# Patient Record
Sex: Male | Born: 1965 | Race: White | Hispanic: No | Marital: Married | Smoking: Never smoker
Health system: Southern US, Community
[De-identification: ages and names within clinical notes are randomized; demographics above are authoritative.]

## PROBLEM LIST (undated history)

## (undated) DIAGNOSIS — F419 Anxiety disorder, unspecified: Secondary | ICD-10-CM

## (undated) DIAGNOSIS — I10 Essential (primary) hypertension: Secondary | ICD-10-CM

## (undated) DIAGNOSIS — T8859XA Other complications of anesthesia, initial encounter: Secondary | ICD-10-CM

## (undated) DIAGNOSIS — Z87442 Personal history of urinary calculi: Secondary | ICD-10-CM

## (undated) DIAGNOSIS — E785 Hyperlipidemia, unspecified: Secondary | ICD-10-CM

## (undated) DIAGNOSIS — M109 Gout, unspecified: Secondary | ICD-10-CM

## (undated) DIAGNOSIS — F1010 Alcohol abuse, uncomplicated: Secondary | ICD-10-CM

## (undated) DIAGNOSIS — K5792 Diverticulitis of intestine, part unspecified, without perforation or abscess without bleeding: Secondary | ICD-10-CM

## (undated) DIAGNOSIS — J45909 Unspecified asthma, uncomplicated: Secondary | ICD-10-CM

## (undated) DIAGNOSIS — K219 Gastro-esophageal reflux disease without esophagitis: Secondary | ICD-10-CM

## (undated) HISTORY — PX: BACK SURGERY: SHX140

## (undated) HISTORY — PX: APPENDECTOMY: SHX54

## (undated) HISTORY — PX: BLADDER SURGERY: SHX569

## (undated) HISTORY — PX: CHOLECYSTECTOMY: SHX55

## (undated) HISTORY — PX: ADENOIDECTOMY: SUR15

---

## 2004-04-09 ENCOUNTER — Emergency Department (HOSPITAL_COMMUNITY): Admission: EM | Admit: 2004-04-09 | Discharge: 2004-04-09 | Payer: Self-pay | Admitting: Emergency Medicine

## 2004-04-10 ENCOUNTER — Inpatient Hospital Stay (HOSPITAL_COMMUNITY): Admission: RE | Admit: 2004-04-10 | Discharge: 2004-04-12 | Payer: Self-pay | Admitting: Psychiatry

## 2006-01-24 ENCOUNTER — Ambulatory Visit: Payer: Self-pay | Admitting: Urology

## 2007-06-01 ENCOUNTER — Ambulatory Visit: Payer: Self-pay | Admitting: Internal Medicine

## 2007-08-02 ENCOUNTER — Ambulatory Visit: Payer: Self-pay | Admitting: Internal Medicine

## 2007-11-20 ENCOUNTER — Ambulatory Visit: Payer: Self-pay | Admitting: Internal Medicine

## 2008-02-06 ENCOUNTER — Inpatient Hospital Stay: Payer: Self-pay | Admitting: Surgery

## 2008-02-26 ENCOUNTER — Emergency Department (HOSPITAL_COMMUNITY): Admission: EM | Admit: 2008-02-26 | Discharge: 2008-02-26 | Payer: Self-pay | Admitting: Emergency Medicine

## 2008-05-06 ENCOUNTER — Ambulatory Visit: Payer: Self-pay | Admitting: Internal Medicine

## 2008-09-16 ENCOUNTER — Inpatient Hospital Stay: Payer: Self-pay | Admitting: Internal Medicine

## 2008-12-16 ENCOUNTER — Encounter (INDEPENDENT_AMBULATORY_CARE_PROVIDER_SITE_OTHER): Payer: Self-pay | Admitting: Interventional Radiology

## 2008-12-16 ENCOUNTER — Ambulatory Visit (HOSPITAL_COMMUNITY): Admission: RE | Admit: 2008-12-16 | Discharge: 2008-12-16 | Payer: Self-pay | Admitting: Gastroenterology

## 2010-12-04 ENCOUNTER — Ambulatory Visit: Payer: Self-pay | Admitting: Urology

## 2010-12-07 ENCOUNTER — Ambulatory Visit: Payer: Self-pay | Admitting: Urology

## 2010-12-13 ENCOUNTER — Encounter: Payer: Self-pay | Admitting: Gastroenterology

## 2011-03-08 LAB — CBC
MCHC: 33.5 g/dL (ref 30.0–36.0)
Platelets: 234 10*3/uL (ref 150–400)
RDW: 13.6 % (ref 11.5–15.5)

## 2011-03-08 LAB — PROTIME-INR
INR: 1.3 (ref 0.00–1.49)
Prothrombin Time: 16.9 seconds — ABNORMAL HIGH (ref 11.6–15.2)

## 2011-04-09 NOTE — Discharge Summary (Signed)
NAME:  Douglas Vega, Douglas Vega NO.:  [id number]   MEDICAL RECORD NO.:  [id number]                   PATIENT TYPE:  IPS   LOCATION:  0500                                 FACILITY:  [facility]   PHYSICIAN:  Jasmine Pang, M.D.              DATE OF BIRTH:  02-28-1966   DATE OF ADMISSION:  04/10/2004  DATE OF DISCHARGE:  04/12/2004                                 DISCHARGE SUMMARY   BRIEF REASON FOR ADMISSION:  The patient was a 45 year old married Caucasian  male who was admitted on a voluntary basis.  He had been using alcohol for  15 years and presented to the emergency department requesting detox.  His  drinking has been increasing over the past 5 years and he states now he  needs to drink in the morning to get going.  He has 1 pint to a fifth of  alcohol daily.  He also states he is using cocaine occasionally and smoking  THC.  Mood is depressed and anxious.  He was on Zoloft 6 months ago but quit  because of the sexual side effects.  He denies suicidal ideation or  homicidal ideation.  No auditory or visual hallucinations.   PAST PSYCHIATRIC HISTORY:  This is his first [facility]  admission, no prior treatment.  As indicated above he had been on Zoloft for  6 weeks but stopped due to sexual side effects.   PAST MEDICAL HISTORY:  Hypertension, fatty liver, appendectomy, T&A.  No  history of seizures.   MEDICATIONS:  Ativan ?dose, Diltiazem XR 180 mg 2 caps daily.   DRUG ALLERGIES:  No known drug allergies.   PHYSICAL EXAMINATION:  See the physical examination sheet from the emergency  department.   ADMISSION LABORATORIES:  TSH was within normal limits.  Urinalysis was  within normal limits.  Other labs were done in the emergency department and  were reviewed by the emergency department physician.   HOSPITAL COURSE:  Upon admission, the patient was placed on a phenobarbital  protocol.  He was given p.r.n. Ambien 10 mg for sleep and Gatorade  was  ordered to be pushed.  He was also placed on Diltiazem 180 mg p.o. q.a.m.  On Apr 10, 2004 he was given additional Diltiazem CD 180 mg p.o. x1 and then  it was ordered to increase Diltiazem to 360 mg CD daily starting May 21 due  to increased blood pressure.  Blood pressure checks being done q.i.d.  On  Apr 11, 2004 he was given Indocin tablets 1 q.4 hours p.r.n. pain from gout.  He was also started on Colchicine 0.6 mg p.o. b.i.d. to start tonight to  treat gout.  On Apr 12, 2004 he was given Vicodin 5/500 1-2 q.6 hours p.r.n.  pain.  On Apr 12, 2004 he was started on Indocin 25 mg 1-2 tabs p.o. q.6  hours p.r.n. pain.  The patient tolerated  medications well.  He tolerated  the phenobarbital detox protocol without incident and was safely detoxed.  He was able to interact appropriately in the unit milieu and groups.  He had  visits from his wife which were beneficial.  She was supportive of him.   At the time of discharge his mental status had improved markedly.  He was  less depressed, had good eye contact, was less anxious.  There was no  suicidal or homicidal ideation.  Psychomotor activity was within normal  limits.  There was no psychosis.  Thought processes were logical and goal  directed.  Cognition was within normal limits.   DISCHARGE DIAGNOSES:   AXIS I:  1. Alcohol dependence.  2. Polysubstance abuse.  3. Depressive disorder not otherwise specified.   AXIS II:  No diagnosis.   AXIS III:  Hypertension, gout.   AXIS IV:  Moderate, work stress.   AXIS V:  Global assessment of function on discharge 50, global assessment of  function upon admission 30, global assessment of function highest past year  62.   DISCHARGE MEDICATIONS:  1. Phenobarbital 15 mg twice daily for 2 days then stop (to complete the     final day of the phenobarbital protocol).  2. Diltiazem CD/ER 360 mg daily.  3. Colchicine 0.6 mg b.i.d.  4. Ambien 10 mg p.o. q.h.s. p.r.n. insomnia.    ACTIVITY LEVEL:  No restrictions.   DIET:  No restrictions.   POST HOSPITAL CARE PLAN:  The patient will attend the [facility] and he will be  referred for further psychiatric care and therapy from there.  He stated he  may attend an IOP program closer to [facility] if this could be  arranged.   CONDITION ON DISCHARGE:  Improved.                                               Jasmine Pang, M.D.    Mosie Epstein  D:  06/12/2004  T:  06/13/2004  Job:  [id number]

## 2011-08-17 LAB — COMPREHENSIVE METABOLIC PANEL
AST: 69 — ABNORMAL HIGH
Albumin: 3.8
Alkaline Phosphatase: 126 — ABNORMAL HIGH
BUN: 4 — ABNORMAL LOW
Chloride: 99
Creatinine, Ser: 0.64
GFR calc Af Amer: >60
Potassium: 4.2
Total Bilirubin: 1.3 — ABNORMAL HIGH
Total Protein: 6.9

## 2011-08-17 LAB — CBC
Platelets: 169
RDW: 15.6 — ABNORMAL HIGH
WBC: 6.2

## 2011-08-17 LAB — URINALYSIS, ROUTINE W REFLEX MICROSCOPIC
Bilirubin Urine: NEGATIVE
Glucose, UA: NEGATIVE
Hgb urine dipstick: NEGATIVE
Ketones, ur: NEGATIVE
Specific Gravity, Urine: 1.011
pH: 8

## 2011-08-17 LAB — DIFFERENTIAL
Basophils Absolute: 0
Eosinophils Relative: 0
Lymphocytes Relative: 9 — ABNORMAL LOW
Monocytes Absolute: 0.5
Monocytes Relative: 8
Neutro Abs: 5.1

## 2011-08-17 LAB — POCT I-STAT, CHEM 8
Calcium, Ion: 0.99 — ABNORMAL LOW
Chloride: 103
Creatinine, Ser: 0.6
Glucose, Bld: 112 — ABNORMAL HIGH
HCT: 44
Potassium: 4.2

## 2017-01-31 ENCOUNTER — Encounter: Payer: Self-pay | Admitting: *Deleted

## 2017-02-01 ENCOUNTER — Ambulatory Visit: Payer: BC Managed Care – PPO | Admitting: *Deleted

## 2017-02-01 ENCOUNTER — Encounter: Payer: Self-pay | Admitting: Gastroenterology

## 2017-02-01 ENCOUNTER — Ambulatory Visit
Admission: RE | Admit: 2017-02-01 | Discharge: 2017-02-01 | Disposition: A | Discharge status: Home / Self Care | Payer: BC Managed Care – PPO | Source: Ambulatory Visit | Attending: Gastroenterology | Admitting: Gastroenterology

## 2017-02-01 ENCOUNTER — Encounter
Admission: RE | Disposition: A | Discharge status: Home / Self Care | Payer: Self-pay | Source: Ambulatory Visit | Attending: Gastroenterology

## 2017-02-01 DIAGNOSIS — K5730 Diverticulosis of large intestine without perforation or abscess without bleeding: Secondary | ICD-10-CM | POA: Diagnosis not present

## 2017-02-01 DIAGNOSIS — Z79899 Other long term (current) drug therapy: Secondary | ICD-10-CM | POA: Diagnosis not present

## 2017-02-01 DIAGNOSIS — I10 Essential (primary) hypertension: Secondary | ICD-10-CM | POA: Diagnosis not present

## 2017-02-01 DIAGNOSIS — Z1211 Encounter for screening for malignant neoplasm of colon: Secondary | ICD-10-CM | POA: Diagnosis present

## 2017-02-01 DIAGNOSIS — J45909 Unspecified asthma, uncomplicated: Secondary | ICD-10-CM | POA: Insufficient documentation

## 2017-02-01 DIAGNOSIS — Z7982 Long term (current) use of aspirin: Secondary | ICD-10-CM | POA: Diagnosis not present

## 2017-02-01 DIAGNOSIS — K219 Gastro-esophageal reflux disease without esophagitis: Secondary | ICD-10-CM | POA: Diagnosis not present

## 2017-02-01 DIAGNOSIS — F419 Anxiety disorder, unspecified: Secondary | ICD-10-CM | POA: Diagnosis not present

## 2017-02-01 DIAGNOSIS — M109 Gout, unspecified: Secondary | ICD-10-CM | POA: Insufficient documentation

## 2017-02-01 DIAGNOSIS — K640 First degree hemorrhoids: Secondary | ICD-10-CM | POA: Insufficient documentation

## 2017-02-01 DIAGNOSIS — Z8371 Family history of colonic polyps: Secondary | ICD-10-CM | POA: Insufficient documentation

## 2017-02-01 DIAGNOSIS — E785 Hyperlipidemia, unspecified: Secondary | ICD-10-CM | POA: Insufficient documentation

## 2017-02-01 HISTORY — DX: Essential (primary) hypertension: I10

## 2017-02-01 HISTORY — DX: Alcohol abuse, uncomplicated: F10.10

## 2017-02-01 HISTORY — DX: Diverticulitis of intestine, part unspecified, without perforation or abscess without bleeding: K57.92

## 2017-02-01 HISTORY — DX: Hyperlipidemia, unspecified: E78.5

## 2017-02-01 HISTORY — DX: Unspecified asthma, uncomplicated: J45.909

## 2017-02-01 HISTORY — PX: COLONOSCOPY WITH PROPOFOL: SHX5780

## 2017-02-01 HISTORY — DX: Gastro-esophageal reflux disease without esophagitis: K21.9

## 2017-02-01 HISTORY — DX: Gout, unspecified: M10.9

## 2017-02-01 HISTORY — DX: Anxiety disorder, unspecified: F41.9

## 2017-02-01 SURGERY — COLONOSCOPY WITH PROPOFOL
Anesthesia: General

## 2017-02-01 MED ORDER — PROPOFOL 500 MG/50ML IV EMUL
INTRAVENOUS | Status: AC
  Filled 2017-02-01: qty 50

## 2017-02-01 MED ORDER — PROPOFOL 500 MG/50ML IV EMUL
INTRAVENOUS | Status: DC | PRN
  Administered 2017-02-01: 2500 ug via INTRAVENOUS

## 2017-02-01 MED ORDER — MIDAZOLAM HCL 2 MG/2ML IJ SOLN
INTRAMUSCULAR | Status: AC
  Filled 2017-02-01: qty 2

## 2017-02-01 MED ORDER — SODIUM CHLORIDE 0.9 % IV SOLN
INTRAVENOUS | Status: DC
  Administered 2017-02-01: 11:00:00 via INTRAVENOUS

## 2017-02-01 MED ORDER — SODIUM CHLORIDE 0.9 % IV SOLN
INTRAVENOUS | Status: DC
  Administered 2017-02-01: 1000 mL via INTRAVENOUS

## 2017-02-01 NOTE — Anesthesia Postprocedure Evaluation (Signed)
Anesthesia Post Note  Patient: Douglas Vega  Procedure(s) Performed: Procedure(s) (LRB): COLONOSCOPY WITH PROPOFOL (N/A)  Patient location during evaluation: Endoscopy Anesthesia Type: General Level of consciousness: awake and alert and oriented Pain management: pain level controlled Vital Signs Assessment: post-procedure vital signs reviewed and stable Respiratory status: spontaneous breathing, nonlabored ventilation and respiratory function stable Cardiovascular status: blood pressure returned to baseline and stable Postop Assessment: no signs of nausea or vomiting Anesthetic complications: no     Last Vitals:  Vitals:   02/01/17 1210 02/01/17 1220  BP: 125/64   Pulse:    Resp:  19  Temp:      Last Pain:  Vitals:   02/01/17 1200  TempSrc: Tympanic                 [name]

## 2017-02-01 NOTE — Transfer of Care (Signed)
Immediate Anesthesia Transfer of Care Note  Patient: Douglas Vega  Procedure(s) Performed: Procedure(s): COLONOSCOPY WITH PROPOFOL (N/A)  Patient Location: PACU  Anesthesia Type:General  Level of Consciousness: awake, alert  and oriented  Airway & Oxygen Therapy: Patient Spontanous Breathing and Patient connected to nasal cannula oxygen  Post-op Assessment: Report given to RN and Post -op Vital signs reviewed and stable  Post vital signs: Reviewed and stable  Last Vitals:  Vitals:   02/01/17 1045 02/01/17 1200  BP: (!) 156/88 (!) 103/57  Pulse: 70 65  Resp: 16 16  Temp: (!) 35.9 C 36.2 C    Last Pain:  Vitals:   02/01/17 1200  TempSrc: Tympanic         Complications: No apparent anesthesia complications

## 2017-02-01 NOTE — Anesthesia Post-op Follow-up Note (Cosign Needed)
Anesthesia QCDR form completed.        

## 2017-02-01 NOTE — Op Note (Signed)
[facility] Gastroenterology Patient Name: Ranger Gallien Procedure Date: 02/01/2017 11:11 AM MRN: [id number] Account #: [id number] Date of Birth: 1966-01-19 Admit Type: Outpatient Age: 51 Room: [facility] ENDO ROOM 3 Gender: Male Note Status: Finalized Procedure:            Colonoscopy Indications:          Screening for colorectal malignant neoplasm Providers:            Lollie Sails, MD Referring MD:         Ocie Cornfield. Ouida Sills MD, MD (Referring MD) Medicines:            Monitored Anesthesia Care Complications:        No immediate complications. Procedure:            Pre-Anesthesia Assessment:                       - ASA Grade Assessment: III - A patient with severe                        systemic disease.                       After obtaining informed consent, the colonoscope was                        passed under direct vision. Throughout the procedure,                        the patient's blood pressure, pulse, and oxygen                        saturations were monitored continuously. The                        Colonoscope was introduced through the anus and                        advanced to the the cecum, identified by appendiceal                        orifice and ileocecal valve. The colonoscopy was                        performed with moderate difficulty due to multiple                        diverticula in the colon. Successful completion of the                        procedure was aided by changing the patient to a supine                        position and changing the patient to a prone position.                        The quality of the bowel preparation was good. Findings:      Many small and large-mouthed diverticula were found in the sigmoid colon       and descending colon.      Non-bleeding internal hemorrhoids were found during retroflexion, during  digital exam and during anoscopy. The hemorrhoids were medium-sized and       Grade I  (internal hemorrhoids that do not prolapse).      The digital rectal exam was otherwise normal. Impression:           - Diverticulosis in the sigmoid colon and in the                        descending colon.                       - Non-bleeding internal hemorrhoids.                       - No specimens collected. Recommendation:       - Discharge patient to home.                       - Repeat colonoscopy in 10 years for screening purposes. Procedure Code(s):    --- Professional ---                       [phone], Colonoscopy, flexible; diagnostic, including                        collection of specimen(s) by brushing or washing, when                        performed (separate procedure) Diagnosis Code(s):    --- Professional ---                       Z12.11, Encounter for screening for malignant neoplasm                        of colon                       K64.0, First degree hemorrhoids                       K57.30, Diverticulosis of large intestine without                        perforation or abscess without bleeding CPT copyright 2016 American Medical Association. All rights reserved. The codes documented in this report are preliminary and upon coder review may  be revised to meet current compliance requirements. Lollie Sails, MD 02/01/2017 12:00:24 PM This report has been signed electronically. Number of Addenda: 0 Note Initiated On: 02/01/2017 11:11 AM Scope Withdrawal Time: 0 hours 9 minutes 11 seconds  Total Procedure Duration: 0 hours 21 minutes 6 seconds       [facility]

## 2017-02-01 NOTE — H&P (Signed)
Outpatient short stay form Pre-procedure 02/01/2017 10:55 AM Lollie Sails MD  Primary Physician: Dr. Frazier Richards  Reason for visit:  Colonoscopy  History of present illness:  Patient is a 51 year old male presenting today as above. There is a family history of colon polyps and a primary relative, mother. Does take a daily 81 mg aspirin the last time this morning. He tolerated his prep well. He denies use of any other blood thinning agents.    Current Facility-Administered Medications:  .  0.9 %  sodium chloride infusion, , Intravenous, Continuous, Lollie Sails, MD, Last Rate: 20 mL/hr at 02/01/17 1054, 1,000 mL at 02/01/17 1054 .  0.9 %  sodium chloride infusion, , Intravenous, Continuous, Lollie Sails, MD  Prescriptions Prior to Admission  Medication Sig Dispense Refill Last Dose  . albuterol (PROVENTIL HFA;VENTOLIN HFA) 108 (90 Base) MCG/ACT inhaler Inhale 2 puffs into the lungs every 6 (six) hours as needed for wheezing or shortness of breath.   Past Month at Unknown time  . aspirin EC 81 MG tablet Take 81 mg by mouth daily.   02/01/2017 at 0800  . omeprazole (PRILOSEC) 20 MG capsule Take 20 mg by mouth daily.   02/01/2017 at 0800  . valsartan-hydrochlorothiazide (DIOVAN-HCT) 80-12.5 MG tablet Take 1 tablet by mouth daily.   02/01/2017 at 0800  . predniSONE (DELTASONE) 20 MG tablet Take 20 mg by mouth daily with breakfast.   Not Taking at Unknown time     No Known Allergies   Past Medical History:  Diagnosis Date  . Alcohol abuse   . Anxiety   . Asthma   . Diverticulitis   . GERD (gastroesophageal reflux disease)   . Gout   . Hyperlipidemia   . Hypertension     Review of systems:      Physical Exam    Heart and lungs: Regular rate and rhythm without rub or gallop, lungs are bilaterally clear.    HEENT: Normocephalic atraumatic eyes are anicteric    Other:     Pertinant exam for procedure: Soft nontender nondistended bowel sounds positive  normoactive.    Planned proceedures: Colonoscopy and indicated procedures. I have discussed the risks benefits and complications of procedures to include not limited to bleeding, infection, perforation and the risk of sedation and the patient wishes to proceed.    Lollie Sails, MD Gastroenterology 02/01/2017  10:55 AM

## 2017-02-01 NOTE — Anesthesia Preprocedure Evaluation (Signed)
Anesthesia Evaluation  Patient identified by MRN, date of birth, ID band Patient awake    Reviewed: Allergy & Precautions, NPO status , Patient's Chart, lab work & pertinent test results  History of Anesthesia Complications Negative for: history of anesthetic complications  Airway Mallampati: II  TM Distance: >3 FB Neck ROM: Full    Dental no notable dental hx.    Pulmonary neg sleep apnea, neg COPD,    breath sounds clear to auscultation- rhonchi (-) wheezing      Cardiovascular Exercise Tolerance: Good hypertension, Pt. on medications (-) angina(-) CAD and (-) Past MI  Rhythm:Regular Rate:Normal - Systolic murmurs and - Diastolic murmurs    Neuro/Psych negative neurological ROS  negative psych ROS   GI/Hepatic Neg liver ROS, GERD  ,  Endo/Other  negative endocrine ROSneg diabetes  Renal/GU negative Renal ROS     Musculoskeletal negative musculoskeletal ROS (+)   Abdominal (+) - obese,   Peds  Hematology negative hematology ROS (+)   Anesthesia Other Findings Past Medical History: No date: Alcohol abuse No date: Anxiety No date: Asthma No date: Diverticulitis No date: GERD (gastroesophageal reflux disease) No date: Gout No date: Hyperlipidemia No date: Hypertension   Reproductive/Obstetrics                             Anesthesia Physical Anesthesia Plan  ASA: II  Anesthesia Plan: General   Post-op Pain Management:    Induction: Intravenous  Airway Management Planned: Natural Airway  Additional Equipment:   Intra-op Plan:   Post-operative Plan:   Informed Consent: I have reviewed the patients History and Physical, chart, labs and discussed the procedure including the risks, benefits and alternatives for the proposed anesthesia with the patient or authorized representative who has indicated his/her understanding and acceptance.   Dental advisory given  Plan  Discussed with: CRNA and Anesthesiologist  Anesthesia Plan Comments:         Anesthesia Quick Evaluation

## 2019-02-28 DIAGNOSIS — C4491 Basal cell carcinoma of skin, unspecified: Secondary | ICD-10-CM

## 2019-02-28 HISTORY — DX: Basal cell carcinoma of skin, unspecified: C44.91

## 2020-01-15 DIAGNOSIS — L570 Actinic keratosis: Secondary | ICD-10-CM

## 2020-01-15 HISTORY — DX: Actinic keratosis: L57.0

## 2020-02-18 ENCOUNTER — Ambulatory Visit: Payer: BC Managed Care – PPO

## 2020-02-28 ENCOUNTER — Ambulatory Visit: Payer: BC Managed Care – PPO | Admitting: Dermatology

## 2020-03-06 ENCOUNTER — Ambulatory Visit: Payer: BC Managed Care – PPO | Admitting: Dermatology

## 2020-03-06 ENCOUNTER — Other Ambulatory Visit: Payer: Self-pay

## 2020-03-06 ENCOUNTER — Encounter: Payer: Self-pay | Admitting: Dermatology

## 2020-03-06 DIAGNOSIS — L578 Other skin changes due to chronic exposure to nonionizing radiation: Secondary | ICD-10-CM | POA: Diagnosis not present

## 2020-03-06 DIAGNOSIS — L570 Actinic keratosis: Secondary | ICD-10-CM | POA: Diagnosis not present

## 2020-03-06 DIAGNOSIS — Z85828 Personal history of other malignant neoplasm of skin: Secondary | ICD-10-CM

## 2020-03-06 NOTE — Progress Notes (Signed)
   Follow-Up Visit   Subjective  [place] is a 54 y.o. male who presents for the following: Follow-up (bx). Patient has a biopsy proven AK to be treated today. He has a history of BCC of the right anterior chin status post Mohs surgery recently.  He is healing well but has some nerve changes residually.  This is improving.  The following portions of the chart were reviewed this encounter and updated as appropriate: Tobacco  Allergies  Meds  Problems  Med Hx  Surg Hx  Fam Hx      Review of Systems: No other skin or systemic complaints.  Objective  Well appearing patient in no apparent distress; mood and affect are within normal limits.  A focused examination was performed including face. Relevant physical exam findings are noted in the Assessment and Plan.  Objective  Nasal Tip: Pink biopsy site.  Objective  Right Anterior Chin: Well healed scar with no evidence of recurrence.   Assessment & Plan   Actinic Damage - diffuse scaly erythematous macules with underlying dyspigmentation - Recommend daily broad spectrum sunscreen SPF 30+ to sun-exposed areas, reapply every 2 hours as needed.  - Call for new or changing lesions.   AK (actinic keratosis) Nasal Tip  Biopsy proven.  Will start topical chemotherapy on f/u.  Destruction of lesion - Nasal Tip Complexity: simple   Destruction method: cryotherapy   Informed consent: discussed and consent obtained   Timeout:  patient name, date of birth, surgical site, and procedure verified Lesion destroyed using liquid nitrogen: Yes   Region frozen until ice ball extended beyond lesion: Yes   Outcome: patient tolerated procedure well with no complications   Post-procedure details: wound care instructions given    History of basal cell carcinoma (BCC) Right Anterior Chin  Clear. Observe for recurrence. Call clinic for new or changing lesions.  Recommend regular skin exams, daily broad-spectrum spf 30+ sunscreen use,  and photoprotection.     Return in about 2 months (around 05/06/2020) for AK, nasal tip and TBSE.   Lindi Adie, CMA, am acting as scribe for Sarina Ser, MD . Documentation: I have reviewed the above documentation for accuracy and completeness, and I agree with the above.  Sarina Ser, MD

## 2020-05-20 ENCOUNTER — Other Ambulatory Visit: Payer: Self-pay

## 2020-05-20 ENCOUNTER — Ambulatory Visit: Payer: BC Managed Care – PPO | Admitting: Dermatology

## 2020-05-20 DIAGNOSIS — D229 Melanocytic nevi, unspecified: Secondary | ICD-10-CM | POA: Diagnosis not present

## 2020-05-20 DIAGNOSIS — L570 Actinic keratosis: Secondary | ICD-10-CM

## 2020-05-20 DIAGNOSIS — L814 Other melanin hyperpigmentation: Secondary | ICD-10-CM

## 2020-05-20 DIAGNOSIS — D1800 Hemangioma unspecified site: Secondary | ICD-10-CM

## 2020-05-20 DIAGNOSIS — Z85828 Personal history of other malignant neoplasm of skin: Secondary | ICD-10-CM | POA: Diagnosis not present

## 2020-05-20 DIAGNOSIS — Z1283 Encounter for screening for malignant neoplasm of skin: Secondary | ICD-10-CM | POA: Diagnosis not present

## 2020-05-20 DIAGNOSIS — L821 Other seborrheic keratosis: Secondary | ICD-10-CM

## 2020-05-20 DIAGNOSIS — S0081XA Abrasion of other part of head, initial encounter: Secondary | ICD-10-CM

## 2020-05-20 DIAGNOSIS — L578 Other skin changes due to chronic exposure to nonionizing radiation: Secondary | ICD-10-CM

## 2020-05-20 DIAGNOSIS — T148XXA Other injury of unspecified body region, initial encounter: Secondary | ICD-10-CM

## 2020-05-20 MED ORDER — MUPIROCIN 2 % EX OINT: TOPICAL_OINTMENT | CUTANEOUS | 1 refills | Status: DC

## 2020-05-20 NOTE — Progress Notes (Signed)
Follow-Up Visit   Subjective  Douglas Vega is a 54 y.o. male who presents for the following: Annual Exam (TBSE, Hx of BCC on the R chin treated with Mohs, ), Actinic Keratosis (f/u hx of AKs nose ), and sore spot (check a spot on his L upper lip pt cut shaving 1 day ago ). The patient presents for Total-Body Skin Exam (TBSE) for skin cancer screening and mole check.  The following portions of the chart were reviewed this encounter and updated as appropriate:  Tobacco  Allergies  Meds  Problems  Med Hx  Surg Hx  Fam Hx      Review of Systems:  No other skin or systemic complaints except as noted in HPI or Assessment and Plan.  Objective  Well appearing patient in no apparent distress; mood and affect are within normal limits.  A full examination was performed including scalp, head, eyes, ears, nose, lips, neck, chest, axillae, abdomen, back, buttocks, bilateral upper extremities, bilateral lower extremities, hands, feet, fingers, toes, fingernails, and toenails. All findings within normal limits unless otherwise noted below.  Objective  Nasal tip, L zygoma (2): Erythematous thin papules/macules with gritty scale.   Images      Objective  chin: Crusted patch   Objective  R ant chin: Well healed scar with no evidence of recurrence.    Assessment & Plan    AK (actinic keratosis) (2) Nasal tip, L zygoma  May consider re-biopsy if not gone after Ln2, 5FU treatment  Start 5FU apply to nose bid x 7 days, begin in 1 month  [email or web address]  Destruction of lesion - Nasal tip, L zygoma Complexity: simple   Destruction method: cryotherapy   Informed consent: discussed and consent obtained   Timeout:  patient name, date of birth, surgical site, and procedure verified Lesion destroyed using liquid nitrogen: Yes   Region frozen until ice ball extended beyond lesion: Yes   Outcome: patient tolerated procedure well with no complications   Post-procedure details:  wound care instructions given    Abrasion chin Traumatic shaving abrasion  Start Mupirocin ointment apply to skin qd-bid   Ordered Medications: mupirocin ointment (BACTROBAN) 2 %  History of basal cell carcinoma (BCC) R ant chin S/P Mohs. Clear. Observe for recurrence. Call clinic for new or changing lesions.  Recommend regular skin exams, daily broad-spectrum spf 30+ sunscreen use, and photoprotection.   Observe    Lentigines - Scattered tan macules - Discussed due to sun exposure - Benign, observe - Call for any changes  Seborrheic Keratoses - Stuck-on, waxy, tan-brown papules and plaques  - Discussed benign etiology and prognosis. - Observe - Call for any changes  Melanocytic Nevi - Tan-brown and/or pink-flesh-colored symmetric macules and papules - Benign appearing on exam today - Observation - Call clinic for new or changing moles - Recommend daily use of broad spectrum spf 30+ sunscreen to sun-exposed areas.   Hemangiomas - Red papules - Discussed benign nature - Observe - Call for any changes  Actinic Damage - diffuse scaly erythematous macules with underlying dyspigmentation - Recommend daily broad spectrum sunscreen SPF 30+ to sun-exposed areas, reapply every 2 hours as needed.  - Call for new or changing lesions.  Skin cancer screening performed today.  Return in about 3 months (around 08/20/2020) for s.   I, Marye Round, CMA, am acting as scribe for Sarina Ser, MD .  Documentation: I have reviewed the above documentation for accuracy and completeness, and I agree with the  above.  Sarina Ser, MD

## 2020-05-20 NOTE — Patient Instructions (Addendum)
Cryotherapy Aftercare  . Wash gently with soap and water everyday.   . Apply Vaseline and Band-Aid daily until healed.  Instructions for [facility] Medications  One or more of your medications was sent to the [facility] mail order compounding pharmacy. You will receive an email from them and can purchase the medicine through that link. It will then be mailed to your home at the address you confirmed. If for any reason you do not receive an email from them, please check your spam folder. If you still do not find the email, please let us know.    

## 2020-06-02 ENCOUNTER — Encounter: Payer: Self-pay | Admitting: Dermatology

## 2020-08-07 ENCOUNTER — Ambulatory Visit: Payer: BC Managed Care – PPO | Admitting: Dermatology

## 2020-08-07 ENCOUNTER — Encounter: Payer: Self-pay | Admitting: Dermatology

## 2020-08-07 ENCOUNTER — Other Ambulatory Visit: Payer: Self-pay

## 2020-08-07 DIAGNOSIS — Z872 Personal history of diseases of the skin and subcutaneous tissue: Secondary | ICD-10-CM | POA: Diagnosis not present

## 2020-08-07 DIAGNOSIS — L578 Other skin changes due to chronic exposure to nonionizing radiation: Secondary | ICD-10-CM | POA: Diagnosis not present

## 2020-08-07 DIAGNOSIS — L570 Actinic keratosis: Secondary | ICD-10-CM

## 2020-08-07 DIAGNOSIS — Z85828 Personal history of other malignant neoplasm of skin: Secondary | ICD-10-CM

## 2020-08-07 NOTE — Progress Notes (Signed)
   Follow-Up Visit   Subjective  Douglas Vega is a 54 y.o. male who presents for the following: Actinic Keratosis (Nasal tip, L zygoma, 17m f/u, LN2 and 5FU/Calcipotriene x 1 wk, he did get a reaction, pt did say he picks at the one on the nasal tip).  The following portions of the chart were reviewed this encounter and updated as appropriate:  Tobacco  Allergies  Meds  Problems  Med Hx  Surg Hx  Fam Hx     Review of Systems:  No other skin or systemic complaints except as noted in HPI or Assessment and Plan.  Objective  Well appearing patient in no apparent distress; mood and affect are within normal limits.  A focused examination was performed including face. Relevant physical exam findings are noted in the Assessment and Plan.  Objective  nasal tip x 1: Residual pink scaly macule  Objective  L zygoma: Clear today  Objective  R ant chin: Well healed scar with no evidence of recurrence.    Assessment & Plan  AK (actinic keratosis) nasal tip x 1  Bx proven 01/15/20 Hx of Ln2 03/06/20, 05/20/20 and 5FU/Calcipotriene x 1 week  Today Ln2 again,  In 2 weeks restart 5FU/Calcipotriene cream bid x 1 week to nasal tip  Destruction of lesion - nasal tip x 1 Complexity: simple   Destruction method: cryotherapy   Informed consent: discussed and consent obtained   Timeout:  patient name, date of birth, surgical site, and procedure verified Lesion destroyed using liquid nitrogen: Yes   Region frozen until ice ball extended beyond lesion: Yes   Outcome: patient tolerated procedure well with no complications   Post-procedure details: wound care instructions given    History of actinic keratoses L zygoma Clear, observe for changes   History of basal cell carcinoma (BCC) R ant chin S/P Mohs Clear. Observe for recurrence. Call clinic for new or changing lesions.  Recommend regular skin exams, daily broad-spectrum spf 30+ sunscreen use, and photoprotection.      Actinic Damage - diffuse scaly erythematous macules with underlying dyspigmentation - Recommend daily broad spectrum sunscreen SPF 30+ to sun-exposed areas, reapply every 2 hours as needed.  - Call for new or changing lesions.  Return in about 8 weeks (around 10/02/2020) for AK f/u Nasal tip.   I, Douglas Vega, RMA, am acting as scribe for Douglas Ser, MD .  Documentation: I have reviewed the above documentation for accuracy and completeness, and I agree with the above.  Douglas Ser, MD

## 2020-08-12 ENCOUNTER — Encounter: Payer: Self-pay | Admitting: Dermatology

## 2020-09-01 ENCOUNTER — Ambulatory Visit: Payer: BC Managed Care – PPO | Admitting: Dermatology

## 2020-09-16 ENCOUNTER — Other Ambulatory Visit: Payer: Self-pay | Admitting: Physical Medicine & Rehabilitation

## 2020-09-16 DIAGNOSIS — G8929 Other chronic pain: Secondary | ICD-10-CM

## 2020-09-18 ENCOUNTER — Ambulatory Visit
Admission: RE | Admit: 2020-09-18 | Discharge: 2020-09-18 | Disposition: A | Discharge status: Home / Self Care | Payer: BC Managed Care – PPO | Source: Ambulatory Visit | Attending: Physical Medicine & Rehabilitation | Admitting: Physical Medicine & Rehabilitation

## 2020-09-18 DIAGNOSIS — G8929 Other chronic pain: Secondary | ICD-10-CM

## 2020-09-18 DIAGNOSIS — M5441 Lumbago with sciatica, right side: Secondary | ICD-10-CM

## 2020-12-09 IMAGING — MR MR LUMBAR SPINE W/O CM
4 series · 22 of 48 positions shown · non-contrast
Comparison: 08/02/2007

CLINICAL DATA: Low back pain with bilateral hip and buttock pain.
Right side is worse.

EXAM:
MRI LUMBAR SPINE WITHOUT CONTRAST
TECHNIQUE: Multiplanar, multisequence MR imaging of the lumbar spine was
performed. No intravenous contrast was administered.

[Series 6: T2 · sagittal · 4.0mm · 0.73mm/px · 9 of 15 slices shown (1 of 2)]
[im 1/15]
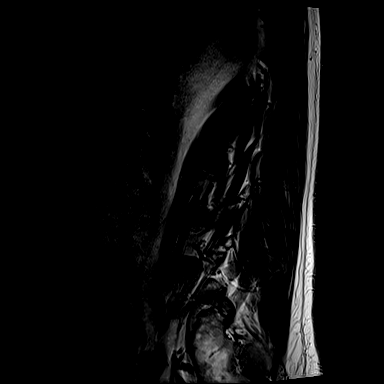
[im 2/15]
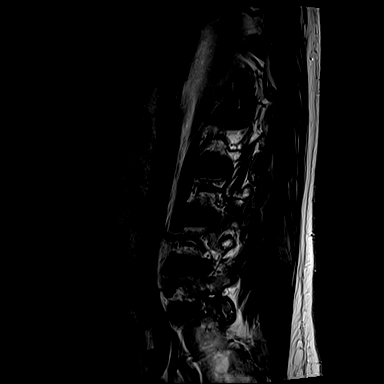
[im 4/15]
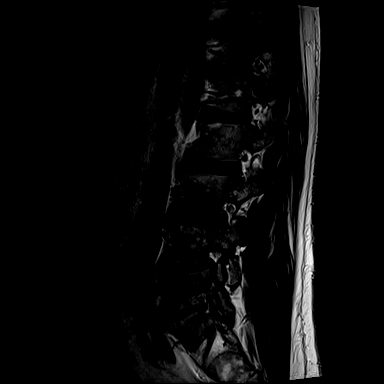
[im 6/15]
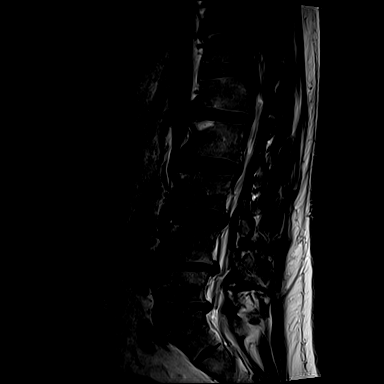
[im 8/15]
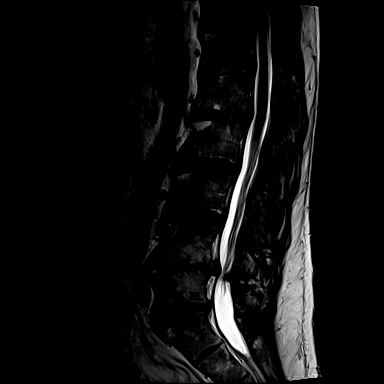
[im 9/15]
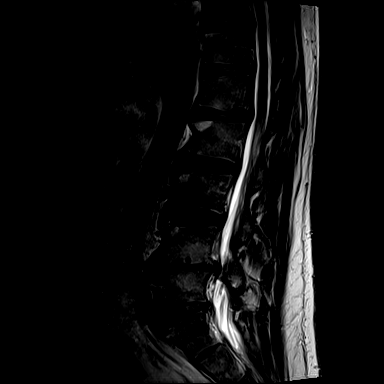
[im 11/15]
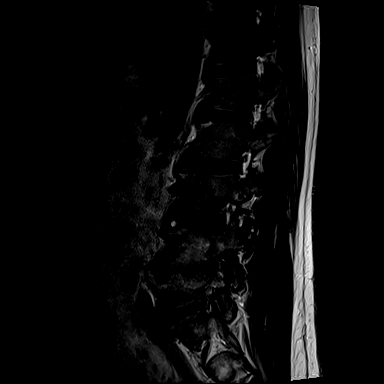
[im 13/15]
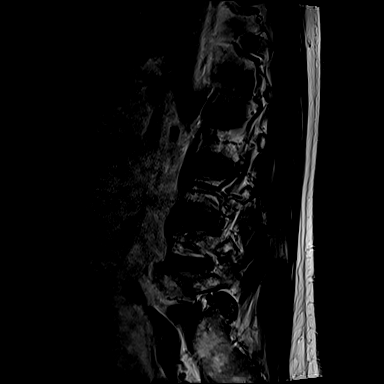
[im 15/15]
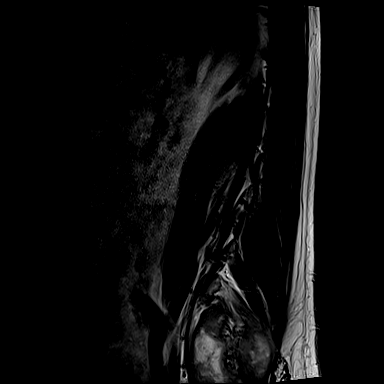

[Series 7: T1 · sagittal · 4.0mm · 0.73mm/px · 3 of 15 slices shown]
[im 3/15]
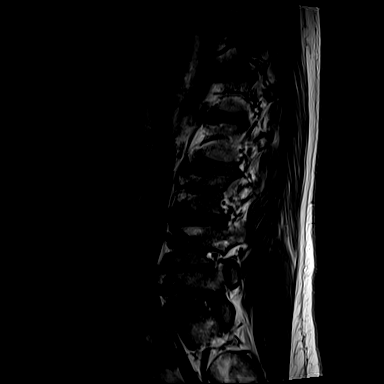
[im 9/15]
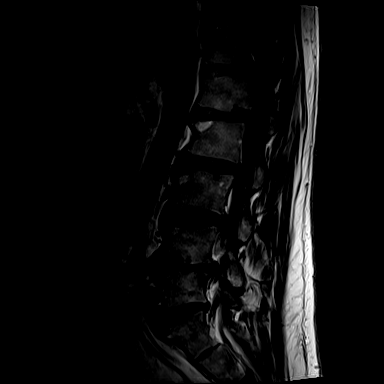
[im 13/15]
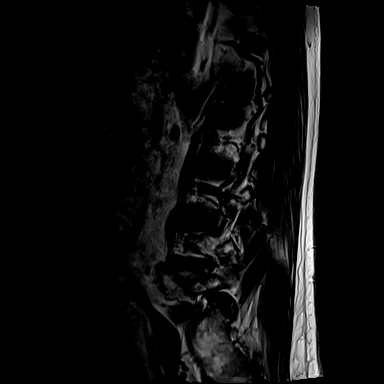

[Series 8: STIR · sagittal · 4.0mm · 0.88mm/px · 3 of 15 slices shown]
[im 3/15]
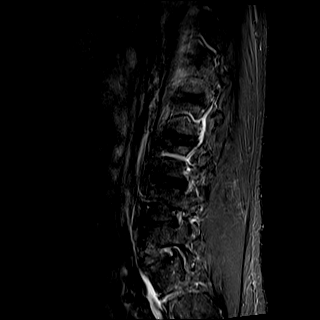
[im 9/15]
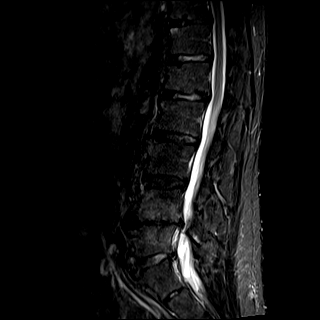
[im 13/15]
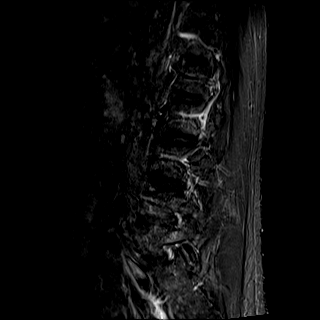

[Series 13: T2 · axial · 4.0mm · 0.28mm/px · z∈[-175,+1]mm · 7 of 42 slices shown (2 of 2)]
[im 2/42]
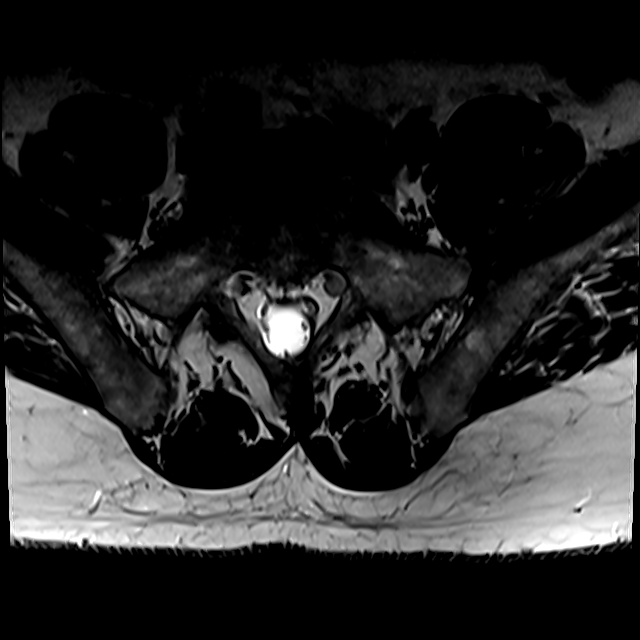
[im 6/42]
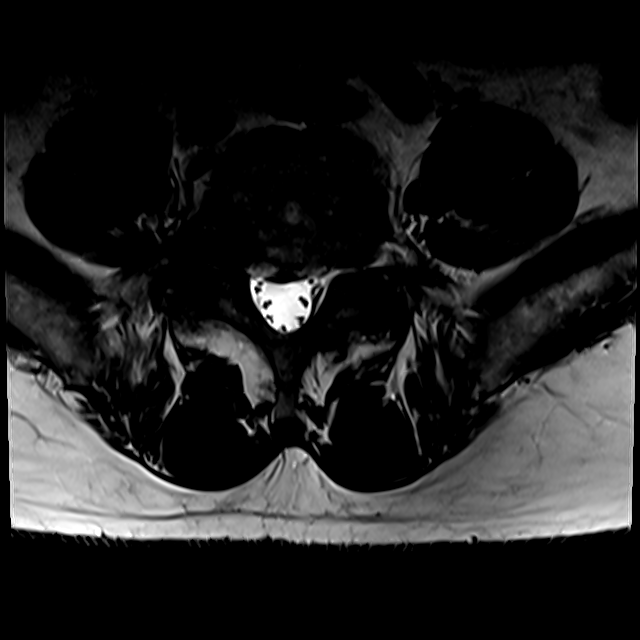
[im 8/42]
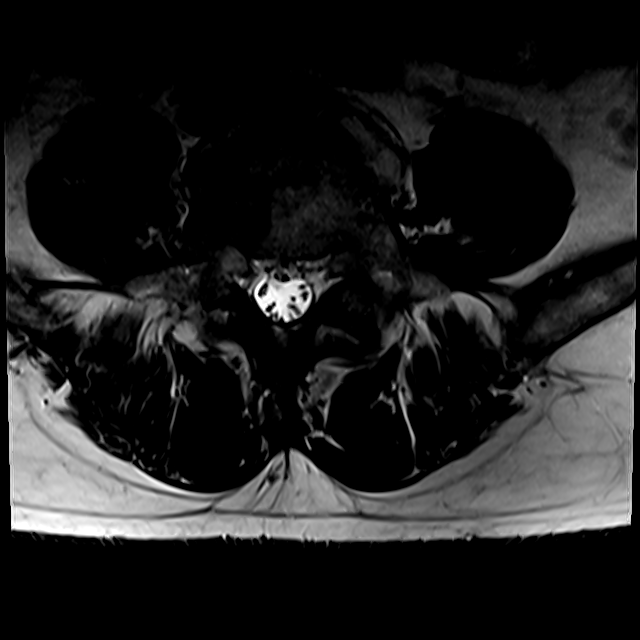
[im 14/42]
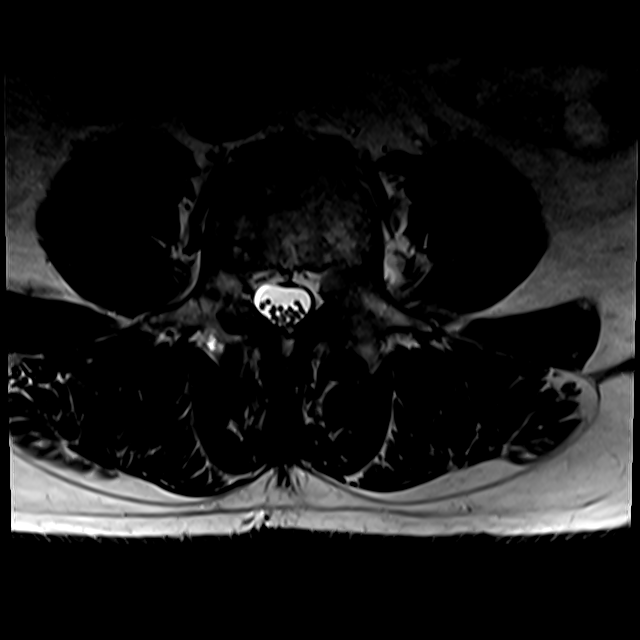
[im 19/42]
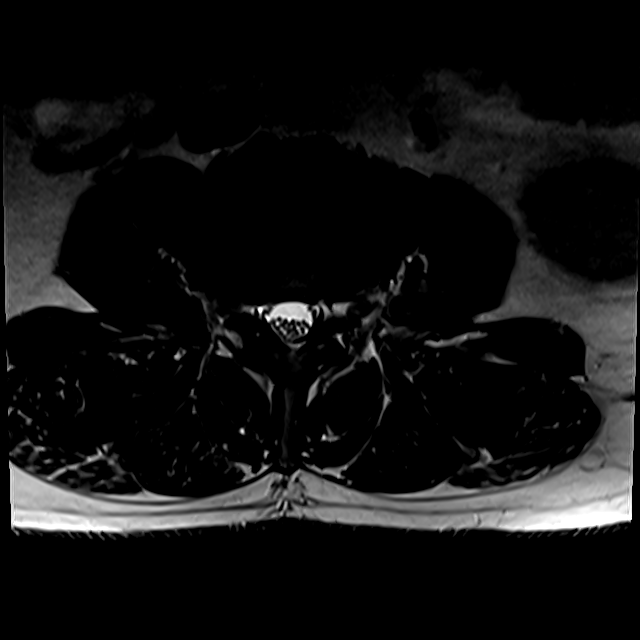
[im 21/42]
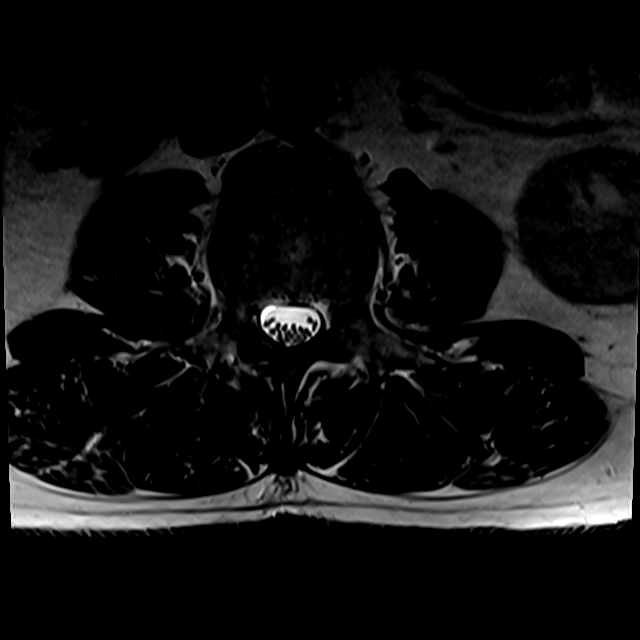
[im 36/42]
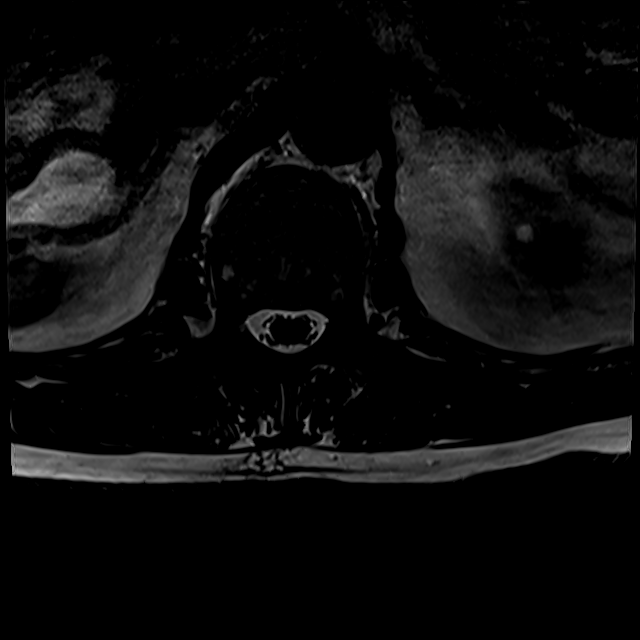

[22 of 48 positions shown; findings below may reference images not displayed]

FINDINGS: Segmentation: The inferior-most fully formed intervertebral disc is
labeled L5-S1.

Alignment: Trace, stepwise retrolisthesis of L2 on L3, L3 on L4, L4
on L5, and L5 on S1.

Vertebrae: No evidence of acute fracture, discitis/osteomyelitis, or
suspicious bone lesion. Degenerative/discogenic endplate signal
changes about the left L4-L5 disc.

Conus medullaris and cauda equina: Conus extends to the L2-L3 level.
Conus and cauda equina appear normal.

Paraspinal and other soft tissues: Negative.

Disc levels:

T12-L1: No significant disc protrusion, foraminal stenosis, or canal
stenosis.

L1-L2: Minimal disc bulge without significant canal or foraminal
stenosis.

L2-L3: Minimal disc bulge without significant canal or foraminal
stenosis.

L3-L4: Mild right eccentric broad-based disc bulge without
significant canal or foraminal stenosis. Mild right greater than
left subarticular recess stenosis.

L4-L5: Left eccentric degenerative disc disease with disc
desiccation, height loss and discogenic endplate signal changes.
Left eccentric broad-based disc bulge and left greater than right
facet hypertrophy. Severe left foraminal stenosis. No significant
right foraminal stenosis. Mild canal and bilateral subarticular
recess stenosis. Findings are progressed since 8776.

L5-S1: Small broad-based disc bulge with moderate bilateral facet
hypertrophy. Moderate to severe right and moderate left foraminal
stenosis. No significant canal stenosis. Findings are progressed
since 8776.
IMPRESSION: 1. At L4-L5 there is severe left foraminal stenosis and mild canal
and subarticular recess stenosis.
2. At L5-S1 there is moderate to severe right and moderate left
foraminal stenosis.

## 2021-04-06 ENCOUNTER — Other Ambulatory Visit: Payer: Self-pay | Admitting: Neurosurgery

## 2021-04-08 ENCOUNTER — Ambulatory Visit: Payer: BC Managed Care – PPO | Admitting: Dermatology

## 2021-04-08 ENCOUNTER — Other Ambulatory Visit: Payer: Self-pay

## 2021-04-08 DIAGNOSIS — D485 Neoplasm of uncertain behavior of skin: Secondary | ICD-10-CM | POA: Diagnosis not present

## 2021-04-08 MED ORDER — MOMETASONE FUROATE 0.1 % EX CREA: 1.0000 "application " | TOPICAL_CREAM | Freq: Every day | CUTANEOUS | 0 refills | Status: DC

## 2021-04-08 NOTE — Patient Instructions (Signed)

## 2021-04-08 NOTE — Progress Notes (Signed)
   Follow-Up Visit   Subjective  Douglas Vega is a 55 y.o. male who presents for the following: Actinic Keratosis (Follow up - left nasal tip - biopsy proven, treated with LN2 and 5FU/Calcipotriene and still there. Patient admits to picking or scratching area constantly and says this has been an issue his entire life.).  The following portions of the chart were reviewed this encounter and updated as appropriate:   Tobacco  Allergies  Meds  Problems  Med Hx  Surg Hx  Fam Hx     Review of Systems:  No other skin or systemic complaints except as noted in HPI or Assessment and Plan.  Objective  Well appearing patient in no apparent distress; mood and affect are within normal limits.  A focused examination was performed including face. Relevant physical exam findings are noted in the Assessment and Plan.  Objective  Left Tip of Nose: Hyperkeratosis with crust 1.1 cm   Assessment & Plan  Neoplasm of uncertain behavior of skin Left Tip of Nose LSC vs recurrent AK - Discussed re-biopsy to R/O LSC vs recurrent AK vs Ca or treat topically for LSC. Patient prefers to try topical treatment first. We will plan biopsy on follow up if persistent.  mometasone (ELOCON) 0.1 % cream Opzelura cream qd (sample given), mometasone cream qd x 2 weeks then decrease to 5 times per week.  Advised patient to avoid picking at area. Suggested covering with a piece of tape or a band aid when possible.  Return in about 6 weeks (around 05/20/2021).   I, Ashok Cordia, CMA, am acting as scribe for Sarina Ser, MD .  Documentation: I have reviewed the above documentation for accuracy and completeness, and I agree with the above.  Sarina Ser, MD

## 2021-04-16 ENCOUNTER — Other Ambulatory Visit: Payer: Self-pay

## 2021-04-16 ENCOUNTER — Encounter
Admission: RE | Admit: 2021-04-16 | Discharge: 2021-04-16 | Disposition: A | Discharge status: Home / Self Care | Payer: BC Managed Care – PPO | Source: Ambulatory Visit | Attending: Neurosurgery | Admitting: Neurosurgery

## 2021-04-16 DIAGNOSIS — I10 Essential (primary) hypertension: Secondary | ICD-10-CM | POA: Insufficient documentation

## 2021-04-16 DIAGNOSIS — Z01818 Encounter for other preprocedural examination: Secondary | ICD-10-CM | POA: Diagnosis not present

## 2021-04-16 HISTORY — DX: Other complications of anesthesia, initial encounter: T88.59XA

## 2021-04-16 LAB — CBC
HCT: 41.7 % (ref 39.0–52.0)
Hemoglobin: 15.1 g/dL (ref 13.0–17.0)
MCH: 31.3 pg (ref 26.0–34.0)
MCHC: 36.2 g/dL — ABNORMAL HIGH (ref 30.0–36.0)
MCV: 86.3 fL (ref 80.0–100.0)
Platelets: 235 10*3/uL (ref 150–400)
RBC: 4.83 MIL/uL (ref 4.22–5.81)
RDW: 12.4 % (ref 11.5–15.5)
WBC: 4.7 10*3/uL (ref 4.0–10.5)
nRBC: 0 % (ref 0.0–0.2)

## 2021-04-16 LAB — BASIC METABOLIC PANEL
Anion gap: 8 (ref 5–15)
BUN: 15 mg/dL (ref 6–20)
CO2: 27 mmol/L (ref 22–32)
Calcium: 9.3 mg/dL (ref 8.9–10.3)
Chloride: 105 mmol/L (ref 98–111)
Creatinine, Ser: 0.76 mg/dL (ref 0.61–1.24)
GFR, Estimated: >60 mL/min (ref >60)
Glucose, Bld: 93 mg/dL (ref 70–99)
Potassium: 3.7 mmol/L (ref 3.5–5.1)
Sodium: 140 mmol/L (ref 135–145)

## 2021-04-16 LAB — URINALYSIS, ROUTINE W REFLEX MICROSCOPIC
Bilirubin Urine: NEGATIVE
Glucose, UA: NEGATIVE mg/dL
Hgb urine dipstick: NEGATIVE
Ketones, ur: NEGATIVE mg/dL
Nitrite: NEGATIVE
Protein, ur: NEGATIVE mg/dL
Specific Gravity, Urine: 1.012 (ref 1.005–1.030)
WBC, UA: >50 WBC/hpf — ABNORMAL HIGH (ref 0–5)
pH: 8 (ref 5.0–8.0)

## 2021-04-16 LAB — TYPE AND SCREEN
ABO/RH(D): O POS
Antibody Screen: NEGATIVE

## 2021-04-16 LAB — APTT: aPTT: 34 seconds (ref 24–36)

## 2021-04-16 LAB — SURGICAL PCR SCREEN
MRSA, PCR: NEGATIVE
Staphylococcus aureus: POSITIVE — AB

## 2021-04-16 LAB — PROTIME-INR
INR: 1 (ref 0.8–1.2)
Prothrombin Time: 13.4 seconds (ref 11.4–15.2)

## 2021-04-16 NOTE — Patient Instructions (Addendum)
Your procedure is scheduled on: 04/27/2021 Report to the Registration Desk on the 1st floor of the [facility]. To find out your arrival time, please call [phone] between 1PM - 3PM on: 04/24/2021  REMEMBER: Instructions that are not followed completely may result in serious medical risk, up to and including death; or upon the discretion of your surgeon and anesthesiologist your surgery may need to be rescheduled.  Do not eat food after midnight the night before surgery.  No gum chewing, lozengers or hard candies.  You may however, drink CLEAR liquids up to 2 hours before you are scheduled to arrive for your surgery. Do not drink anything within 2 hours of your scheduled arrival time.  Clear liquids include: - water  - apple juice without pulp - gatorade - black coffee or tea (Do NOT add milk or creamers to the coffee or tea) Do NOT drink anything that is not on this list.   TAKE THESE MEDICATIONS THE MORNING OF SURGERY WITH A SIP OF WATER: - Prilosec (take one the night before and one on the morning of surgery - helps to prevent nausea after surgery.)  Follow recommendations from Cardiologist, Pulmonologist or PCP regarding stopping Aspirin, Coumadin, Plavix, Eliquis, Pradaxa, or Pletal.  One week prior to surgery: Stop Anti-inflammatories (NSAIDS) such as Advil, Aleve, Ibuprofen, Motrin, Naproxen, Naprosyn and Aspirin based products such as Excedrin, Goodys Powder, BC Powder. You may however, continue to take Tylenol if needed for pain up until the day of surgery. Please stop taking your aspirin 81 mg on 04/21/2021. Stop ANY OVER THE COUNTER supplements until after surgery.   No Alcohol for 24 hours before or after surgery.  No Smoking including e-cigarettes for 24 hours prior to surgery.  No chewable tobacco products for at least 6 hours prior to surgery.  No nicotine patches on the day of surgery.  Do not use any "recreational" drugs for at least a week prior to your  surgery.  Please be advised that the combination of cocaine and anesthesia may have negative outcomes, up to and including death. If you test positive for cocaine, your surgery will be cancelled.  On the morning of surgery brush your teeth with toothpaste and water, you may rinse your mouth with mouthwash if you wish. Do not swallow any toothpaste or mouthwash.  Do not wear jewelry, make-up, hairpins, clips or nail polish.  Do not wear lotions, powders, or perfumes.   Do not shave body from the neck down 48 hours prior to surgery just in case you cut yourself which could leave a site for infection.  Also, freshly shaved skin may become irritated if using the CHG soap.  Contact lenses, hearing aids and dentures may not be worn into surgery.  Do not bring valuables to the hospital. [facility] is not responsible for any missing/lost belongings or valuables.   Notify your doctor if there is any change in your medical condition (cold, fever, infection).  Wear comfortable clothing (specific to your surgery type) to the hospital.  Plan for stool softeners for home use; pain medications have a tendency to cause constipation. You can also help prevent constipation by eating foods high in fiber such as fruits and vegetables and drinking plenty of fluids as your diet allows.  When coughing or sneezing, hold a pillow firmly against your incision with both hands. This is called "splinting." Doing this helps protect your incision. It also decreases belly discomfort.  If you are being admitted to the hospital  overnight, leave your suitcase in the car. After surgery it may be brought to your room.  If you are being discharged the day of surgery, you will not be allowed to drive home. You will need a responsible adult (18 years or older) to drive you home and stay with you that night.   If you are taking public transportation, you will need to have a responsible adult (18 years or older) with  you. Please confirm with your physician that it is acceptable to use public transportation.   Please call the [place] [facility] at [phone] if you have any questions about these instructions.  Surgery Visitation Policy:  Patients undergoing a surgery or procedure may have one family member or support person with them as long as that person is not COVID-19 positive or experiencing its symptoms.  That person may remain in the waiting area during the procedure.  Inpatient Visitation:    Visiting hours are 7 a.m. to 8 p.m. Inpatients will be allowed two visitors daily. The visitors may change each day during the patient's stay. No visitors under the age of 22. Any visitor under the age of 83 must be accompanied by an adult. The visitor must pass COVID-19 screenings, use hand sanitizer when entering and exiting the patient's room and wear a mask at all times, including in the patient's room. Patients must also wear a mask when staff or their visitor are in the room. Masking is required regardless of vaccination status.

## 2021-04-17 ENCOUNTER — Encounter: Payer: Self-pay | Admitting: Dermatology

## 2021-04-19 ENCOUNTER — Telehealth: Payer: Self-pay | Admitting: Urgent Care

## 2021-04-19 LAB — URINE CULTURE: Culture: >=100000 — AB

## 2021-04-19 MED ORDER — NITROFURANTOIN MONOHYD MACRO 100 MG PO CAPS: 100.0000 mg | ORAL_CAPSULE | Freq: Two times a day (BID) | ORAL | 0 refills | Status: AC

## 2021-04-19 NOTE — Progress Notes (Signed)
  [facility] Perioperative Services: Pre-Admission/Anesthesia Testing  Abnormal Lab Notification and Treatment Plan of Care   Date: 04/19/21  Name: Douglas Vega MRN:   [id number]  Re: Abnormal labs noted during PAT appointment   Provider(s) Notified: Deetta Perla, MD Notification mode: Routed and/or faxed via [place] LAB VALUE(S): Lab Results  Component Value Date   COLORURINE YELLOW (A) 04/16/2021   APPEARANCEUR HAZY (A) 04/16/2021   LABSPEC 1.012 04/16/2021   PHURINE 8.0 04/16/2021   GLUCOSEU NEGATIVE 04/16/2021   HGBUR NEGATIVE 04/16/2021   BILIRUBINUR NEGATIVE 04/16/2021   KETONESUR NEGATIVE 04/16/2021   PROTEINUR NEGATIVE 04/16/2021   UROBILINOGEN 1.0 02/26/2008   NITRITE NEGATIVE 04/16/2021   LEUKOCYTESUR MODERATE (A) 04/16/2021   EPIU 0-5 04/16/2021   WBCU >50 (H) 04/16/2021   RBCU 0-5 04/16/2021   BACTERIA FEW (A) 04/16/2021   Lab Results  Component Value Date   CULT >=100,000 COLONIES/mL ENTEROCOCCUS FAECALIS (A) 04/16/2021    Notes:   Patient scheduled for L4-S1 MINIMALLY INVASIVE TRANSFORAMINAL LUMBAR INTERBODY FUSION (TLIF) 2 LEVEL on 04/27/2021.    UA performed in PAT consistent with infection.  . No leukocytosis noted on CBC; WBC 4700 . Renal function normal. Estimated Creatinine Clearance: 112.7 mL/min (by C-G formula based on SCr of 0.76 mg/dL). . Urine C&S added to assess for pathogenically significant growth. - Culture resulted as (+) for >100 CFU/mL Enterococcus faecalis  Impression and Plan:  UA was (+) for infection; reflex culture sent. Contacted patient to discuss. 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 However, with that being said, patient has spinal surgery scheduled soon. In efforts to prevent complications and avoid delaying patient's procedure, I would like to proceed with empiric treatment for presumed  developing/early urinary tract infection.  . Will treat with a 7 day course of nitrofurantoin. Patient encouraged to complete the entire course of antibiotics even if he begins to feel better.    Meds ordered this encounter  Medications  . nitrofurantoin, macrocrystal-monohydrate, (MACROBID) 100 MG capsule    Sig: Take 1 capsule (100 mg total) by mouth 2 (two) times daily for 7 days. Increase WATER intake while taking this medication.    Dispense:  14 capsule    Refill:  0   . Patient encouraged to increase his fluid intake as much as possible. Discussed that water is always best to flush the urinary tract. He was advised to avoid caffeine containing fluids until his infections clears, as caffeine can cause him to experience painful bladder spasms.   . May use Tylenol as needed for pain/fever.   . Results and treatment plan of care forwarded to primary attending surgeon to make him aware.   This is a Community education officer; no formal response is required.  Honor Loh, MSN, APRN, FNP-C, CEN [facility]  Peri-operative Services Nurse Practitioner Phone: [phone] Fax: [phone] 04/19/21 10:01 AM

## 2021-04-23 ENCOUNTER — Other Ambulatory Visit
Admission: RE | Admit: 2021-04-23 | Discharge: 2021-04-23 | Disposition: A | Discharge status: Home / Self Care | Payer: BC Managed Care – PPO | Source: Ambulatory Visit | Attending: Neurosurgery | Admitting: Neurosurgery

## 2021-04-23 ENCOUNTER — Other Ambulatory Visit: Payer: Self-pay

## 2021-04-23 DIAGNOSIS — Z01812 Encounter for preprocedural laboratory examination: Secondary | ICD-10-CM | POA: Insufficient documentation

## 2021-04-23 DIAGNOSIS — Z20822 Contact with and (suspected) exposure to covid-19: Secondary | ICD-10-CM | POA: Insufficient documentation

## 2021-04-24 LAB — SARS CORONAVIRUS 2 (TAT 6-24 HRS): SARS Coronavirus 2: NEGATIVE

## 2021-04-27 ENCOUNTER — Inpatient Hospital Stay: Payer: BC Managed Care – PPO

## 2021-04-27 ENCOUNTER — Inpatient Hospital Stay
Admission: RE | Admit: 2021-04-27 | Discharge: 2021-04-29 | DRG: 460 | Disposition: A | Discharge status: Home / Self Care | Payer: BC Managed Care – PPO | Attending: Neurosurgery | Admitting: Neurosurgery

## 2021-04-27 ENCOUNTER — Other Ambulatory Visit: Payer: Self-pay

## 2021-04-27 ENCOUNTER — Encounter
Admission: RE | Disposition: A | Discharge status: Home / Self Care | Payer: Self-pay | Source: Home / Self Care | Attending: Neurosurgery

## 2021-04-27 ENCOUNTER — Encounter: Payer: Self-pay | Admitting: Neurosurgery

## 2021-04-27 ENCOUNTER — Inpatient Hospital Stay: Payer: BC Managed Care – PPO | Admitting: Urgent Care

## 2021-04-27 DIAGNOSIS — M48061 Spinal stenosis, lumbar region without neurogenic claudication: Principal | ICD-10-CM | POA: Diagnosis present

## 2021-04-27 DIAGNOSIS — E785 Hyperlipidemia, unspecified: Secondary | ICD-10-CM | POA: Diagnosis present

## 2021-04-27 DIAGNOSIS — M4807 Spinal stenosis, lumbosacral region: Secondary | ICD-10-CM | POA: Diagnosis present

## 2021-04-27 DIAGNOSIS — Z79899 Other long term (current) drug therapy: Secondary | ICD-10-CM | POA: Diagnosis not present

## 2021-04-27 DIAGNOSIS — K219 Gastro-esophageal reflux disease without esophagitis: Secondary | ICD-10-CM | POA: Diagnosis present

## 2021-04-27 DIAGNOSIS — M5116 Intervertebral disc disorders with radiculopathy, lumbar region: Secondary | ICD-10-CM | POA: Diagnosis present

## 2021-04-27 DIAGNOSIS — M47819 Spondylosis without myelopathy or radiculopathy, site unspecified: Secondary | ICD-10-CM | POA: Diagnosis present

## 2021-04-27 DIAGNOSIS — Z9049 Acquired absence of other specified parts of digestive tract: Secondary | ICD-10-CM

## 2021-04-27 DIAGNOSIS — Z7982 Long term (current) use of aspirin: Secondary | ICD-10-CM

## 2021-04-27 DIAGNOSIS — I10 Essential (primary) hypertension: Secondary | ICD-10-CM | POA: Diagnosis present

## 2021-04-27 DIAGNOSIS — Z85828 Personal history of other malignant neoplasm of skin: Secondary | ICD-10-CM | POA: Diagnosis not present

## 2021-04-27 DIAGNOSIS — M5416 Radiculopathy, lumbar region: Secondary | ICD-10-CM | POA: Diagnosis present

## 2021-04-27 DIAGNOSIS — Z981 Arthrodesis status: Secondary | ICD-10-CM

## 2021-04-27 DIAGNOSIS — Z419 Encounter for procedure for purposes other than remedying health state, unspecified: Secondary | ICD-10-CM

## 2021-04-27 HISTORY — PX: TRANSFORAMINAL LUMBAR INTERBODY FUSION W/ MIS 2 LEVEL: SHX6146

## 2021-04-27 LAB — CBC
HCT: 37.6 % — ABNORMAL LOW (ref 39.0–52.0)
Hemoglobin: 13.6 g/dL (ref 13.0–17.0)
MCH: 31.7 pg (ref 26.0–34.0)
MCHC: 36.2 g/dL — ABNORMAL HIGH (ref 30.0–36.0)
MCV: 87.6 fL (ref 80.0–100.0)
Platelets: 191 10*3/uL (ref 150–400)
RBC: 4.29 MIL/uL (ref 4.22–5.81)
RDW: 12.6 % (ref 11.5–15.5)
WBC: 8.7 10*3/uL (ref 4.0–10.5)
nRBC: 0 % (ref 0.0–0.2)

## 2021-04-27 LAB — CREATININE, SERUM
Creatinine, Ser: 0.93 mg/dL (ref 0.61–1.24)
GFR, Estimated: >60 mL/min (ref >60)

## 2021-04-27 LAB — ABO/RH: ABO/RH(D): O POS

## 2021-04-27 SURGERY — MINIMALLY INVASIVE (MIS) TRANSFORAMINAL LUMBAR INTERBODY FUSION (TLIF) 2 LEVEL
Anesthesia: General

## 2021-04-27 MED ORDER — ONDANSETRON HCL 4 MG/2ML IJ SOLN
INTRAMUSCULAR | Status: AC
  Filled 2021-04-27: qty 2

## 2021-04-27 MED ORDER — FENTANYL CITRATE (PF) 100 MCG/2ML IJ SOLN
INTRAMUSCULAR | Status: DC | PRN
  Administered 2021-04-27 (×2): 50 ug via INTRAVENOUS

## 2021-04-27 MED ORDER — ROCURONIUM BROMIDE 100 MG/10ML IV SOLN
INTRAVENOUS | Status: DC | PRN
  Administered 2021-04-27: 10 mg via INTRAVENOUS

## 2021-04-27 MED ORDER — HYDROMORPHONE HCL 1 MG/ML IJ SOLN
1.0000 mg | INTRAMUSCULAR | Status: DC | PRN
Start: 2021-04-27 — End: 2021-04-29

## 2021-04-27 MED ORDER — FENTANYL CITRATE (PF) 100 MCG/2ML IJ SOLN
INTRAMUSCULAR | Status: AC
  Filled 2021-04-27: qty 2

## 2021-04-27 MED ORDER — OXYCODONE HCL 5 MG PO TABS: 5.0000 mg | ORAL_TABLET | ORAL | Status: DC | PRN

## 2021-04-27 MED ORDER — ONDANSETRON HCL 4 MG/2ML IJ SOLN
4.0000 mg | Freq: Once | INTRAMUSCULAR | Status: AC | PRN
  Administered 2021-04-27: 4 mg via INTRAVENOUS

## 2021-04-27 MED ORDER — GLYCOPYRROLATE 0.2 MG/ML IJ SOLN
INTRAMUSCULAR | Status: DC | PRN
  Administered 2021-04-27: .2 mg via INTRAVENOUS

## 2021-04-27 MED ORDER — OXYCODONE HCL 5 MG PO TABS
10.0000 mg | ORAL_TABLET | ORAL | Status: DC | PRN
  Administered 2021-04-27 – 2021-04-29 (×10): 10 mg via ORAL
  Filled 2021-04-27 (×10): qty 2

## 2021-04-27 MED ORDER — DEXAMETHASONE SODIUM PHOSPHATE 10 MG/ML IJ SOLN
INTRAMUSCULAR | Status: DC | PRN
  Administered 2021-04-27: 10 mg via INTRAVENOUS

## 2021-04-27 MED ORDER — SUCCINYLCHOLINE CHLORIDE 200 MG/10ML IV SOSY
PREFILLED_SYRINGE | INTRAVENOUS | Status: AC
  Filled 2021-04-27: qty 10

## 2021-04-27 MED ORDER — SODIUM CHLORIDE 0.9% FLUSH
3.0000 mL | Freq: Two times a day (BID) | INTRAVENOUS | Status: DC
  Administered 2021-04-27 – 2021-04-28 (×3): 3 mL via INTRAVENOUS

## 2021-04-27 MED ORDER — MIDAZOLAM HCL 2 MG/2ML IJ SOLN
INTRAMUSCULAR | Status: AC
  Filled 2021-04-27: qty 2

## 2021-04-27 MED ORDER — CEFAZOLIN SODIUM-DEXTROSE 2-4 GM/100ML-% IV SOLN
2.0000 g | Freq: Three times a day (TID) | INTRAVENOUS | Status: AC
  Administered 2021-04-27 – 2021-04-28 (×2): 2 g via INTRAVENOUS
  Filled 2021-04-27 (×2): qty 100

## 2021-04-27 MED ORDER — ACETAMINOPHEN 10 MG/ML IV SOLN
INTRAVENOUS | Status: DC | PRN
  Administered 2021-04-27: 1000 mg via INTRAVENOUS

## 2021-04-27 MED ORDER — DEXMEDETOMIDINE HCL 200 MCG/2ML IV SOLN
INTRAVENOUS | Status: DC | PRN
  Administered 2021-04-27 (×2): 8 ug via INTRAVENOUS

## 2021-04-27 MED ORDER — FENTANYL CITRATE (PF) 100 MCG/2ML IJ SOLN
25.0000 ug | INTRAMUSCULAR | Status: DC | PRN
  Administered 2021-04-27 (×3): 25 ug via INTRAVENOUS

## 2021-04-27 MED ORDER — SUCCINYLCHOLINE CHLORIDE 20 MG/ML IJ SOLN
INTRAMUSCULAR | Status: DC | PRN
  Administered 2021-04-27: 130 mg via INTRAVENOUS

## 2021-04-27 MED ORDER — EPHEDRINE SULFATE 50 MG/ML IJ SOLN
INTRAMUSCULAR | Status: DC | PRN
  Administered 2021-04-27 (×2): 10 mg via INTRAVENOUS

## 2021-04-27 MED ORDER — ACETAMINOPHEN 650 MG RE SUPP: 650.0000 mg | RECTAL | Status: DC | PRN

## 2021-04-27 MED ORDER — B COMPLEX VITAMINS PO CAPS: 1.0000 | ORAL_CAPSULE | Freq: Every morning | ORAL | Status: DC

## 2021-04-27 MED ORDER — REMIFENTANIL HCL 1 MG IV SOLR
INTRAVENOUS | Status: AC
  Filled 2021-04-27: qty 2000

## 2021-04-27 MED ORDER — CEFAZOLIN SODIUM-DEXTROSE 2-4 GM/100ML-% IV SOLN
INTRAVENOUS | Status: AC
  Filled 2021-04-27: qty 100

## 2021-04-27 MED ORDER — ACETAMINOPHEN 325 MG PO TABS
650.0000 mg | ORAL_TABLET | ORAL | Status: DC | PRN
  Administered 2021-04-28: 650 mg via ORAL
  Filled 2021-04-27: qty 2

## 2021-04-27 MED ORDER — MIDAZOLAM HCL 2 MG/2ML IJ SOLN
INTRAMUSCULAR | Status: DC | PRN
  Administered 2021-04-27: 2 mg via INTRAVENOUS

## 2021-04-27 MED ORDER — CHLORHEXIDINE GLUCONATE 0.12 % MT SOLN
OROMUCOSAL | Status: AC
  Administered 2021-04-27: 15 mL via OROMUCOSAL
  Filled 2021-04-27: qty 15

## 2021-04-27 MED ORDER — PROPOFOL 10 MG/ML IV BOLUS
INTRAVENOUS | Status: AC
  Filled 2021-04-27: qty 20

## 2021-04-27 MED ORDER — PHENOL 1.4 % MT LIQD
1.0000 | OROMUCOSAL | Status: DC | PRN
  Filled 2021-04-27: qty 177

## 2021-04-27 MED ORDER — HYDROMORPHONE HCL 1 MG/ML IJ SOLN
0.5000 mg | INTRAMUSCULAR | Status: AC | PRN
Start: 2021-04-27 — End: 2021-04-27
  Administered 2021-04-27 (×2): 0.5 mg via INTRAVENOUS

## 2021-04-27 MED ORDER — ORAL CARE MOUTH RINSE: 15.0000 mL | Freq: Once | OROMUCOSAL | Status: AC

## 2021-04-27 MED ORDER — CEFAZOLIN SODIUM-DEXTROSE 2-4 GM/100ML-% IV SOLN
2.0000 g | Freq: Once | INTRAVENOUS | Status: AC
  Administered 2021-04-27 (×2): 2 g via INTRAVENOUS

## 2021-04-27 MED ORDER — ACETAMINOPHEN 10 MG/ML IV SOLN
INTRAVENOUS | Status: AC
  Filled 2021-04-27: qty 100

## 2021-04-27 MED ORDER — HYDROMORPHONE HCL 1 MG/ML IJ SOLN
INTRAMUSCULAR | Status: AC
  Administered 2021-04-27: 0.5 mg via INTRAVENOUS
  Filled 2021-04-27: qty 1

## 2021-04-27 MED ORDER — REMIFENTANIL HCL 1 MG IV SOLR
INTRAVENOUS | Status: DC | PRN
  Administered 2021-04-27: .5 ug/kg/min via INTRAVENOUS

## 2021-04-27 MED ORDER — ONDANSETRON HCL 4 MG PO TABS
4.0000 mg | ORAL_TABLET | Freq: Four times a day (QID) | ORAL | Status: DC | PRN
  Administered 2021-04-29: 4 mg via ORAL
  Filled 2021-04-27: qty 1

## 2021-04-27 MED ORDER — PROPOFOL 10 MG/ML IV BOLUS
INTRAVENOUS | Status: DC | PRN
  Administered 2021-04-27: 150 mg via INTRAVENOUS
  Administered 2021-04-27: 50 mg via INTRAVENOUS
  Administered 2021-04-27: 100 mg via INTRAVENOUS

## 2021-04-27 MED ORDER — ONDANSETRON HCL 4 MG/2ML IJ SOLN
INTRAMUSCULAR | Status: DC | PRN
  Administered 2021-04-27: 4 mg via INTRAVENOUS

## 2021-04-27 MED ORDER — EPHEDRINE 5 MG/ML INJ
INTRAVENOUS | Status: AC
  Filled 2021-04-27: qty 10

## 2021-04-27 MED ORDER — REMIFENTANIL HCL 1 MG IV SOLR
INTRAVENOUS | Status: AC
  Filled 2021-04-27: qty 1000

## 2021-04-27 MED ORDER — SODIUM CHLORIDE 0.9 % IV SOLN: 250.0000 mL | INTRAVENOUS | Status: DC

## 2021-04-27 MED ORDER — VANCOMYCIN HCL 1000 MG IV SOLR
INTRAVENOUS | Status: DC | PRN
  Administered 2021-04-27: 1000 mg via TOPICAL

## 2021-04-27 MED ORDER — LIDOCAINE HCL (CARDIAC) PF 100 MG/5ML IV SOSY
PREFILLED_SYRINGE | INTRAVENOUS | Status: DC | PRN
  Administered 2021-04-27: 100 mg via INTRAVENOUS

## 2021-04-27 MED ORDER — GLYCOPYRROLATE 0.2 MG/ML IJ SOLN
INTRAMUSCULAR | Status: AC
  Filled 2021-04-27: qty 1

## 2021-04-27 MED ORDER — CHLORHEXIDINE GLUCONATE 0.12 % MT SOLN: 15.0000 mL | Freq: Once | OROMUCOSAL | Status: AC

## 2021-04-27 MED ORDER — SODIUM CHLORIDE 0.9 % IV SOLN: INTRAVENOUS | Status: DC

## 2021-04-27 MED ORDER — ROCURONIUM BROMIDE 10 MG/ML (PF) SYRINGE
PREFILLED_SYRINGE | INTRAVENOUS | Status: AC
  Filled 2021-04-27: qty 10

## 2021-04-27 MED ORDER — LACTATED RINGERS IV SOLN: INTRAVENOUS | Status: DC

## 2021-04-27 MED ORDER — SENNA 8.6 MG PO TABS
1.0000 | ORAL_TABLET | Freq: Two times a day (BID) | ORAL | Status: DC
  Administered 2021-04-27 – 2021-04-29 (×4): 8.6 mg via ORAL
  Filled 2021-04-27 (×4): qty 1

## 2021-04-27 MED ORDER — PANTOPRAZOLE SODIUM 40 MG PO TBEC
40.0000 mg | DELAYED_RELEASE_TABLET | Freq: Every day | ORAL | Status: DC
  Administered 2021-04-28 – 2021-04-29 (×2): 40 mg via ORAL
  Filled 2021-04-27 (×2): qty 1

## 2021-04-27 MED ORDER — MENTHOL 3 MG MT LOZG
1.0000 | LOZENGE | OROMUCOSAL | Status: DC | PRN
  Filled 2021-04-27: qty 9

## 2021-04-27 MED ORDER — MAGNESIUM OXIDE -MG SUPPLEMENT 400 (240 MG) MG PO TABS
400.0000 mg | ORAL_TABLET | Freq: Every morning | ORAL | Status: DC
  Administered 2021-04-28 – 2021-04-29 (×2): 400 mg via ORAL
  Filled 2021-04-27 (×2): qty 1

## 2021-04-27 MED ORDER — VITAMIN D 25 MCG (1000 UNIT) PO TABS
1000.0000 [IU] | ORAL_TABLET | Freq: Every morning | ORAL | Status: DC
  Administered 2021-04-28 – 2021-04-29 (×2): 1000 [IU] via ORAL
  Filled 2021-04-27 (×2): qty 1

## 2021-04-27 MED ORDER — ENOXAPARIN SODIUM 40 MG/0.4ML IJ SOSY
40.0000 mg | PREFILLED_SYRINGE | INTRAMUSCULAR | Status: DC
  Administered 2021-04-28 – 2021-04-29 (×2): 40 mg via SUBCUTANEOUS
  Filled 2021-04-27 (×2): qty 0.4

## 2021-04-27 MED ORDER — METHOCARBAMOL 1000 MG/10ML IJ SOLN
500.0000 mg | Freq: Four times a day (QID) | INTRAVENOUS | Status: DC | PRN
  Administered 2021-04-27: 500 mg via INTRAVENOUS
  Filled 2021-04-27: qty 5

## 2021-04-27 MED ORDER — ONDANSETRON HCL 4 MG/2ML IJ SOLN: 4.0000 mg | Freq: Four times a day (QID) | INTRAMUSCULAR | Status: DC | PRN

## 2021-04-27 MED ORDER — FENTANYL CITRATE (PF) 100 MCG/2ML IJ SOLN
INTRAMUSCULAR | Status: AC
  Administered 2021-04-27: 25 ug via INTRAVENOUS
  Filled 2021-04-27: qty 2

## 2021-04-27 MED ORDER — THROMBIN 5000 UNITS EX SOLR
CUTANEOUS | Status: DC | PRN
  Administered 2021-04-27: 5000 [IU] via TOPICAL

## 2021-04-27 MED ORDER — METHOCARBAMOL 500 MG PO TABS
500.0000 mg | ORAL_TABLET | Freq: Four times a day (QID) | ORAL | Status: DC | PRN
  Administered 2021-04-28 – 2021-04-29 (×4): 500 mg via ORAL
  Filled 2021-04-27 (×4): qty 1

## 2021-04-27 MED ORDER — DEXAMETHASONE SODIUM PHOSPHATE 10 MG/ML IJ SOLN
INTRAMUSCULAR | Status: AC
  Filled 2021-04-27: qty 1

## 2021-04-27 MED ORDER — SODIUM CHLORIDE 0.9% FLUSH: 3.0000 mL | INTRAVENOUS | Status: DC | PRN

## 2021-04-27 MED ORDER — VALSARTAN-HYDROCHLOROTHIAZIDE 80-12.5 MG PO TABS: 1.0000 | ORAL_TABLET | Freq: Every morning | ORAL | Status: DC

## 2021-04-27 SURGICAL SUPPLY — 94 items
ADH SKN CLS APL DERMABOND .7 (GAUZE/BANDAGES/DRESSINGS) ×1
AGENT HMST MTR 8 SURGIFLO (HEMOSTASIS) ×1
APL PRP STRL LF DISP 70% ISPRP (MISCELLANEOUS) ×1
APL SRG 60D 8 XTD TIP BNDBL (TIP)
BLADE BOVIE TIP EXT 4 (BLADE) ×3 IMPLANT
BULB RESERV EVAC DRAIN JP 100C (MISCELLANEOUS) ×4 IMPLANT
BUR NEURO DRILL SOFT 3.0X3.8M (BURR) ×3 IMPLANT
CHLORAPREP W/TINT 26 (MISCELLANEOUS) ×3 IMPLANT
CLIP NEUROVISION LG (CLIP) ×2 IMPLANT
CNTNR SPEC 2.5X3XGRAD LEK (MISCELLANEOUS) ×1
CONT SPEC 4OZ STER OR WHT (MISCELLANEOUS) ×2
CONT SPEC 4OZ STRL OR WHT (MISCELLANEOUS) ×1
CONTAINER SPEC 2.5X3XGRAD LEK (MISCELLANEOUS) ×1 IMPLANT
COUNTER NEEDLE 20/40 LG (NEEDLE) ×3 IMPLANT
COVER BACK TABLE REUSABLE LG (DRAPES) ×3 IMPLANT
COVER LIGHT HANDLE STERIS (MISCELLANEOUS) ×6 IMPLANT
COVER WAND RF STERILE (DRAPES) ×3 IMPLANT
CUP MEDICINE 2OZ PLAST GRAD ST (MISCELLANEOUS) ×3 IMPLANT
DERMABOND ADVANCED (GAUZE/BANDAGES/DRESSINGS) ×2
DERMABOND ADVANCED .7 DNX12 (GAUZE/BANDAGES/DRESSINGS) ×1 IMPLANT
DRAIN CHANNEL JP 10F RND 20C F (MISCELLANEOUS) ×4 IMPLANT
DRAPE C-ARM 42X70 (DRAPES) ×6 IMPLANT
DRAPE C-ARMOR (DRAPES) ×2 IMPLANT
DRAPE LAPAROTOMY 100X77 ABD (DRAPES) ×3 IMPLANT
DRAPE MICROSCOPE SPINE 48X150 (DRAPES) ×3 IMPLANT
DRAPE SURG 17X11 SM STRL (DRAPES) ×12 IMPLANT
DRSG OPSITE POSTOP 4X6 (GAUZE/BANDAGES/DRESSINGS) ×4 IMPLANT
DRSG TEGADERM 2-3/8X2-3/4 SM (GAUZE/BANDAGES/DRESSINGS) ×4 IMPLANT
DURASEAL APPLICATOR TIP (TIP) IMPLANT
DURASEAL SPINE SEALANT 3ML (MISCELLANEOUS) IMPLANT
ELECT CAUTERY BLADE TIP 2.5 (TIP) ×3
ELECT EZSTD 165MM 6.5IN (MISCELLANEOUS) ×6
ELECT REM PT RETURN 9FT ADLT (ELECTROSURGICAL) ×3
ELECTRODE CAUTERY BLDE TIP 2.5 (TIP) ×1 IMPLANT
ELECTRODE EZSTD 165MM 6.5IN (MISCELLANEOUS) ×1 IMPLANT
ELECTRODE REM PT RTRN 9FT ADLT (ELECTROSURGICAL) ×1 IMPLANT
GAUZE SPONGE 4X4 12PLY STRL (GAUZE/BANDAGES/DRESSINGS) ×3 IMPLANT
GLOVE SRG 8 PF TXTR STRL LF DI (GLOVE) ×1 IMPLANT
GLOVE SURG SYN 7.0 (GLOVE) IMPLANT
GLOVE SURG SYN 7.0 PF PI (GLOVE) ×2 IMPLANT
GLOVE SURG SYN 8.0 (GLOVE) ×6 IMPLANT
GLOVE SURG SYN 8.0 PF PI (GLOVE) ×2 IMPLANT
GLOVE SURG UNDER POLY LF SZ7 (GLOVE) ×1 IMPLANT
GLOVE SURG UNDER POLY LF SZ8 (GLOVE) ×3
GOWN STRL REUS W/ TWL LRG LVL3 (GOWN DISPOSABLE) ×1 IMPLANT
GOWN STRL REUS W/ TWL XL LVL3 (GOWN DISPOSABLE) ×2 IMPLANT
GOWN STRL REUS W/TWL LRG LVL3 (GOWN DISPOSABLE) ×3
GOWN STRL REUS W/TWL XL LVL3 (GOWN DISPOSABLE) ×6
GRADUATE 1200CC STRL 31836 (MISCELLANEOUS) ×3 IMPLANT
IMPL TLX20 7X11X26 20D (Cage) IMPLANT
IV CATH ANGIO 12GX3 LT BLUE (NEEDLE) ×1 IMPLANT
K-WIRE NITOL BLUNT THRD (WIRE) ×18
KIT ACCESS MAXCESS MAS TLIF 2 (KITS) ×2 IMPLANT
KIT SPINAL PRONEVIEW (KITS) ×3 IMPLANT
KIT TURNOVER KIT A (KITS) ×3 IMPLANT
KWIRE NITOL BLUNT THRD (WIRE) IMPLANT
MANIFOLD NEPTUNE II (INSTRUMENTS) ×3 IMPLANT
MARKER SKIN DUAL TIP RULER LAB (MISCELLANEOUS) ×9 IMPLANT
NDL I-PASS III (NEEDLE) IMPLANT
NDL SAFETY ECLIPSE 18X1.5 (NEEDLE) ×1 IMPLANT
NEEDLE HYPO 18GX1.5 SHARP (NEEDLE) ×3
NEEDLE HYPO 22GX1.5 SAFETY (NEEDLE) ×3 IMPLANT
NEEDLE I-PASS III (NEEDLE) ×6 IMPLANT
NS IRRIG 1000ML POUR BTL (IV SOLUTION) ×3 IMPLANT
PACK LAMINECTOMY NEURO (CUSTOM PROCEDURE TRAY) ×3 IMPLANT
PAD ARMBOARD 7.5X6 YLW CONV (MISCELLANEOUS) ×3 IMPLANT
PROBE BALL TIP NVM5 SNG USE (BALLOONS) ×2 IMPLANT
PUTTY DBM PROPEL LRG (Putty) ×1 IMPLANT
PUTTY PROPEL LRG (Putty) ×1 IMPLANT
REDUCTION EXT RELINE MAS MOD (Neuro Prosthesis/Implant) ×8 IMPLANT
SCREW LOCK RELINE 5.5 TULIP (Screw) ×12 IMPLANT
SCREW RED RELINE 7.5X45MM POLY (Screw) ×4 IMPLANT
SCREW SHANK RELINE MOD 7.5X45 (Screw) ×12 IMPLANT
SCREW SHANK RLINE MD 7.5X45 2C (Screw) IMPLANT
SLEEVE SCD COMPRESS KNEE MED (STOCKING) ×2 IMPLANT
SPOGE SURGIFLO 8M (HEMOSTASIS) ×3
SPONGE GAUZE 2X2 8PLY STER LF (GAUZE/BANDAGES/DRESSINGS) ×2
SPONGE GAUZE 2X2 8PLY STRL LF (GAUZE/BANDAGES/DRESSINGS) ×2 IMPLANT
SPONGE KITTNER 5P (MISCELLANEOUS) ×3 IMPLANT
SPONGE SURGIFLO 8M (HEMOSTASIS) ×1 IMPLANT
SUT ETHILON 2 0 FS 18 (SUTURE) ×4 IMPLANT
SUT NURALON 4 0 TR CR/8 (SUTURE) IMPLANT
SUT POLYSORB 2-0 5X18 GS-10 (SUTURE) ×13 IMPLANT
SUT VIC AB 0 CT1 18XCR BRD 8 (SUTURE) IMPLANT
SUT VIC AB 0 CT1 8-18 (SUTURE) ×3
SUT VIC AB 3-0 SH 8-18 (SUTURE) IMPLANT
SYR 10ML LL (SYRINGE) ×6 IMPLANT
SYR 20ML LL LF (SYRINGE) ×3 IMPLANT
SYR 30ML LL (SYRINGE) ×6 IMPLANT
TLX20 IMPLANT 7X11X26 20D (Cage) ×9 IMPLANT
TOWEL OR 17X26 4PK STRL BLUE (TOWEL DISPOSABLE) ×6 IMPLANT
TRAY FOLEY MTR SLVR 16FR STAT (SET/KITS/TRAYS/PACK) ×2 IMPLANT
TUBING CONNECTING 10 (TUBING) ×2 IMPLANT
TUBING CONNECTING 10' (TUBING) ×1

## 2021-04-27 NOTE — Anesthesia Procedure Notes (Signed)
Procedure Name: Intubation Date/Time: 04/27/2021 1:02 PM Performed by: Aline Brochure, CRNA Pre-anesthesia Checklist: Patient identified, Emergency Drugs available, Suction available and Patient being monitored Patient Re-evaluated:Patient Re-evaluated prior to induction Oxygen Delivery Method: Circle system utilized Preoxygenation: Pre-oxygenation with 100% oxygen Induction Type: IV induction Ventilation: Mask ventilation without difficulty Laryngoscope Size: McGraph and 4 Grade View: Grade I Tube type: Oral Tube size: 7.5 mm Number of attempts: 1 Airway Equipment and Method: Stylet and Video-laryngoscopy Placement Confirmation: ETT inserted through vocal cords under direct vision,  positive ETCO2 and breath sounds checked- equal and bilateral Secured at: 22 cm Tube secured with: Tape Dental Injury: Teeth and Oropharynx as per pre-operative assessment

## 2021-04-27 NOTE — Transfer of Care (Signed)
Immediate Anesthesia Transfer of Care Note  Patient: Douglas Vega  Procedure(s) Performed: L4-S1 MINIMALLY INVASIVE (MIS) TRANSFORAMINAL LUMBAR INTERBODY FUSION (TLIF) 2 LEVEL (N/A )  Patient Location: PACU  Anesthesia Type:General  Level of Consciousness: awake and alert   Airway & Oxygen Therapy: Patient Spontanous Breathing and Patient connected to face mask oxygen  Post-op Assessment: Report given to RN and Post -op Vital signs reviewed and stable  Post vital signs: Reviewed and stable  Last Vitals:  Vitals Value Taken Time  BP 149/80 04/27/21 1832  Temp    Pulse 78 04/27/21 1838  Resp 8 04/27/21 1838  SpO2 98 % 04/27/21 1838  Vitals shown include unvalidated device data.  Last Pain:  Vitals:   04/27/21 1043  TempSrc: Temporal  PainSc: 3          Complications: No complications documented.

## 2021-04-27 NOTE — Interval H&P Note (Signed)
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  The various methods of treatment have been discussed with the patient and family. After consideration of risks, benefits and other options for treatment, the patient has consented to  Procedure(s): L4-S1 MINIMALLY INVASIVE (MIS) TRANSFORAMINAL LUMBAR INTERBODY FUSION (TLIF) 2 LEVEL (N/A) as a surgical intervention.  The patient's history has been reviewed, patient examined, no change in status, stable for surgery.  I have reviewed the patient's chart and labs.  Questions were answered to the patient's satisfaction.     Deetta Perla

## 2021-04-27 NOTE — Anesthesia Preprocedure Evaluation (Signed)
Anesthesia Evaluation  Patient identified by MRN, date of birth, ID band Patient awake    Reviewed: Allergy & Precautions, NPO status , Patient's Chart, lab work & pertinent test results  History of Anesthesia Complications (+) history of anesthetic complications (HR drop with dental procedure)  Airway Mallampati: II       Dental   Pulmonary sleep apnea (resolved with stopping ETOH) , neg COPD, Not current smoker,           Cardiovascular hypertension, Pt. on medications (-) Past MI and (-) CHF (-) dysrhythmias (-) Valvular Problems/Murmurs     Neuro/Psych neg Seizures    GI/Hepatic GERD  ,(+)     substance abuse (stopped 12 years ago)  alcohol use,   Endo/Other  neg diabetes  Renal/GU negative Renal ROS     Musculoskeletal   Abdominal   Peds  Hematology   Anesthesia Other Findings   Reproductive/Obstetrics                             Anesthesia Physical Anesthesia Plan  ASA: II  Anesthesia Plan: General   Post-op Pain Management:    Induction: Intravenous  PONV Risk Score and Plan: 2  Airway Management Planned: Oral ETT  Additional Equipment:   Intra-op Plan:   Post-operative Plan:   Informed Consent: I have reviewed the patients History and Physical, chart, labs and discussed the procedure including the risks, benefits and alternatives for the proposed anesthesia with the patient or authorized representative who has indicated his/her understanding and acceptance.       Plan Discussed with:   Anesthesia Plan Comments:         Anesthesia Quick Evaluation

## 2021-04-27 NOTE — Anesthesia Postprocedure Evaluation (Signed)
Anesthesia Post Note  Patient: Douglas Vega  Procedure(s) Performed: L4-S1 MINIMALLY INVASIVE (MIS) TRANSFORAMINAL LUMBAR INTERBODY FUSION (TLIF) 2 LEVEL (N/A )  Patient location during evaluation: PACU Anesthesia Type: General Level of consciousness: awake and alert Pain management: pain level controlled Vital Signs Assessment: post-procedure vital signs reviewed and stable Respiratory status: spontaneous breathing, nonlabored ventilation, respiratory function stable and patient connected to nasal cannula oxygen Cardiovascular status: blood pressure returned to baseline and stable Postop Assessment: no apparent nausea or vomiting Anesthetic complications: no   No complications documented.   Last Vitals:  Vitals:   04/27/21 1915 04/27/21 1930  BP: 126/73 119/61  Pulse: 67 63  Resp: (!) 9 15  Temp:    SpO2: 96% 95%    Last Pain:  Vitals:   04/27/21 1915  TempSrc:   PainSc: 7                  Arita Miss

## 2021-04-27 NOTE — H&P (Signed)
Douglas Vega is an 55 y.o. male.   Chief Complaint: Back and leg pain HPI: Douglas Vega is here for evaluation of ongoing symptoms of back and bilateral buttocks pain. This has been going on for 3 years but seems to be worsening. The right leg was more bothersome but he now is having bilateral symptoms. He does get some tingling in his toes but he does not report any other obvious numbness. The pain will start as he gets out of bed in the morning and persist throughout the day whenever he is moving around. However, more recently he is also noted some discomfort while sitting and driving for long distances. He did get 2 injections but only got about a week relief from these. He is doing home therapy exercises. He did have an MRI of his lumbar spine showing stenosis at L4/5 and L5/S1 with more foraminal stenosis and collapse of L4/5 disc space. Given the need for facetectomy for decompression, L4-S1 fusion and decompression was discussed and he wishes to proceed.   Past Medical History:  Diagnosis Date  . Actinic keratosis 01/15/2020   Nasal tip.   Marland Kitchen Alcohol abuse   . Basal cell carcinoma 02/28/2019   right ant chin. Excised: 10/22/19, lateral and deep margins involved. MOHS  . Complication of anesthesia    Low HR with prior sedation during dental procedure  . Diverticulitis   . GERD (gastroesophageal reflux disease)   . Gout   . Hyperlipidemia   . Hypertension     Past Surgical History:  Procedure Laterality Date  . ADENOIDECTOMY    . APPENDECTOMY    . BLADDER SURGERY    . CHOLECYSTECTOMY    . COLONOSCOPY WITH PROPOFOL N/A 02/01/2017   Procedure: COLONOSCOPY WITH PROPOFOL;  Surgeon: Lollie Sails, MD;  [facility] ENDOSCOPY;  Service: Endoscopy;  Laterality: N/A;    History reviewed. No pertinent family history. Social History:  reports that he has never smoked. He has never used smokeless tobacco. He reports previous alcohol use. He reports previous drug use.  Allergies: No  Known Allergies  Medications Prior to Admission  Medication Sig Dispense Refill  . aspirin EC 81 MG tablet Take 81 mg by mouth in the morning.    Marland Kitchen b complex vitamins capsule 1 capsule in the morning.    . cholecalciferol (VITAMIN D3) 25 MCG (1000 UNIT) tablet Take 1,000 Units by mouth in the morning.    . magnesium oxide (MAG-OX) 400 MG tablet Take 400 mg by mouth in the morning.    . mometasone (ELOCON) 0.1 % cream Apply 1 application topically daily. X 2 weeks then decrease to 5 times per week 45 g 0  . omeprazole (PRILOSEC) 20 MG capsule Take 20 mg by mouth in the morning.    Marland Kitchen OVER THE COUNTER MEDICATION Take 4 capsules by mouth in the morning and at bedtime. Relief Factor    . valsartan-hydrochlorothiazide (DIOVAN-HCT) 80-12.5 MG tablet Take 1 tablet by mouth in the morning.      Results for orders placed or performed during the hospital encounter of 04/27/21 (from the past 48 hour(s))  ABO/Rh     Status: None   Collection Time: 04/27/21 11:00 AM  Result Value Ref Range   ABO/RH(D)      O POS Performed at [facility], [address] [place], [place] [address]    No results found.  Review of Systems General ROS: Negative Psychological ROS: Negative Ophthalmic ROS: Negative ENT ROS: Negative  Hematological and Lymphatic ROS: Negative  Endocrine ROS: Negative Respiratory ROS: Negative Cardiovascular ROS: Negative Gastrointestinal ROS: Negative Genito-Urinary ROS: Negative Musculoskeletal ROS: Positive for back pain Neurological ROS: Positive for buttocks pain Dermatological ROS: Negative  Blood pressure (!) 164/89, pulse 69, temperature 98 F (36.7 C), temperature source Temporal, resp. rate 16, SpO2 99 %. Physical Exam  General appearance: Alert, cooperative, in no acute distress Head: Normocephalic, atraumatic Eyes: Normal, EOM intact Oropharynx: Wearing facemask Back: No tenderness to palpation Ext: No edema in LE bilaterally  Neurologic exam:   Mental status: alertness: alert, affect: normal Speech: fluent and clear Motor:strength symmetric 5/5 in bilateral lower extremities Sensory: intact to light touch in bilateral lower extremities Gait: normal   Imaging: MRI lumbar spine: There is straightening of the lordotic curvature. There is severe degenerative disease most notable at L4-5 and L5-S1. The alignment remains well-maintained. There is a left eccentric disc protrusion at L4-5 causing left foraminal stenosis that is severe. At L5-S1 there is more right severe foraminal stenosis. There is collapse of the disc spaces at these levels.   Assessment/Plan Proceed with L4-S1 fusion and decompression  Deetta Perla, MD 04/27/2021, 11:58 AM

## 2021-04-28 ENCOUNTER — Inpatient Hospital Stay: Payer: BC Managed Care – PPO

## 2021-04-28 ENCOUNTER — Encounter: Payer: Self-pay | Admitting: Neurosurgery

## 2021-04-28 MED ORDER — IRBESARTAN 150 MG PO TABS
75.0000 mg | ORAL_TABLET | Freq: Every day | ORAL | Status: DC
  Administered 2021-04-28 – 2021-04-29 (×2): 75 mg via ORAL
  Filled 2021-04-28 (×2): qty 1

## 2021-04-28 MED ORDER — HYDROCHLOROTHIAZIDE 12.5 MG PO CAPS
12.5000 mg | ORAL_CAPSULE | Freq: Every day | ORAL | Status: DC
  Administered 2021-04-28 – 2021-04-29 (×2): 12.5 mg via ORAL
  Filled 2021-04-28 (×2): qty 1

## 2021-04-28 NOTE — Progress Notes (Signed)
   Progress Note   Date: 04/28/2021  HPI: Mr Krack is admitted after L4-S1 fusion and decompression for lumbar radiculopathy and back pain  POD#1: Pain in back being controlled with medication. No leg pain endorsed. His foley catheter is removed and he is voiding. He has not ambulated yet. LSO brace is in room.   Vital Signs: Temp:  [97.5 F (36.4 C)-98.4 F (36.9 C)] 98.4 F (36.9 C) (06/07 1132) Pulse Rate:  [57-81] 57 (06/07 1132) Resp:  [7-23] 16 (06/07 1132) BP: (91-149)/(61-84) 133/84 (06/07 1132) SpO2:  [95 %-100 %] 99 % (06/07 1132) Temp (24hrs), Avg:98 F (36.7 C), Min:97.5 F (36.4 C), Max:98.4 F (36.9 C)    Problem List Patient Active Problem List   Diagnosis Date Noted  . Spinal stenosis of lumbar region with radiculopathy 04/27/2021    Medications: Scheduled Meds: . cholecalciferol  1,000 Units Oral q AM  . enoxaparin (LOVENOX) injection  40 mg Subcutaneous Q24H  . hydrochlorothiazide  12.5 mg Oral Daily  . irbesartan  75 mg Oral Daily  . magnesium oxide  400 mg Oral q AM  . pantoprazole  40 mg Oral Daily  . senna  1 tablet Oral BID  . sodium chloride flush  3 mL Intravenous Q12H   Continuous Infusions: . sodium chloride    . sodium chloride    . methocarbamol (ROBAXIN) IV Stopped (04/27/21 2203)   PRN Meds:.acetaminophen **OR** acetaminophen, HYDROmorphone (DILAUDID) injection, menthol-cetylpyridinium **OR** phenol, methocarbamol **OR** methocarbamol (ROBAXIN) IV, ondansetron **OR** ondansetron (ZOFRAN) IV, oxyCODONE, oxyCODONE, sodium chloride flush  Labs:  Lab Results  Component Value Date   WBC 8.7 04/27/2021   WBC 4.7 04/16/2021   HCT 37.6 (L) 04/27/2021   HCT 41.7 04/16/2021   PLT 191 04/27/2021   PLT 235 04/16/2021    Lab Results  Component Value Date   INR 1.0 04/16/2021   APTT 34 04/16/2021   Lab Results  Component Value Date   NA 140 04/16/2021   NA 136 02/26/2008   K 3.7 04/16/2021   K 4.2 02/26/2008   BUN 15 04/16/2021    BUN 5 (L) 02/26/2008   No results found for: MG  Exam: Incisions flat, clean JP drains in place Left: 40 cc out Right: 35 cc out  Strength 5/5 throughout bilateral lower extremities Sensation intact in lower extremities  A/P Mr Schreiter is POD#1 from L4-S1 TLIF  - Ambulate with PT/OT - LSO brace when out of bed - Pain control with oral meds, IV breakthrough - Start DVT prophylaxis 6/6 - Foley is out, voiding  - Need xrays - Advance diet  Deetta Perla, MD

## 2021-04-28 NOTE — Evaluation (Addendum)
Occupational Therapy Evaluation Patient Details Name: Douglas Vega MRN: [id number] DOB: [birth year] Today's Date: 04/28/2021    History of Present Illness Pt is a 55 yo male s/p L4-S1 interbody fusion PMH of GERD, HTN, HLD, etoh abuse, sleep apnea.   Clinical Impression   Patient presenting with decreased independence in self care, balance, functional mobility/transfers, endurance, and safety awareness.  Patient reports living at home with wife PTA. He reports prior to surgery ambulation and standing being difficult and therefore sitting and laying down quite a bit for pain management. OT reviewed back precautions related to self care tasks and discussed when to have spinal brace donned. Pt verbalized understanding. Patient currently functioning at. Patient will benefit from acute OT to increase overall independence in the areas of ADLs, functional mobility, and safety awareness in order to safely discharge home with family.    Follow Up Recommendations  Home health OT;Supervision - Intermittent    Equipment Recommendations  None recommended by OT       Precautions / Restrictions Precautions Precautions: Fall;Back Precaution Booklet Issued: No Required Braces or Orthoses: Spinal Brace Spinal Brace: Other (comment);Applied in sitting position Spinal Brace Comments: LSO Restrictions Weight Bearing Restrictions: No      Mobility Bed Mobility Overal bed mobility: Needs Assistance Bed Mobility: Rolling;Sidelying to Sit Rolling: Min guard Sidelying to sit: Min assist       General bed mobility comments: Pt seated in recliner chair when entering the room    Transfers Overall transfer level: Needs assistance Equipment used: Rolling walker (2 wheeled) Transfers: Sit to/from Stand Sit to Stand: Min guard;Min assist         General transfer comment: MinA for initial standing balance first transfer, CGA for remainder. Cued for safe hand placement    Balance Overall balance  assessment: Needs assistance Sitting-balance support: Feet supported Sitting balance-Leahy Scale: Good Sitting balance - Comments: limited due to pain     Standing balance-Leahy Scale: Poor Standing balance comment: did have a few instances of decreased UE support with ambulation, but primarily reliant on UEs                           ADL either performed or assessed with clinical judgement   ADL Overall ADL's : Needs assistance/impaired                     Lower Body Dressing: Minimal assistance                 General ADL Comments: Pt able to demonstrate figure four position for LB self care with cuing for back precautions. Discussed possible need for AE to increased independence. R LE more difficult and OT recommended he start with that LE.     Vision Baseline Vision/History: Wears glasses Patient Visual Report: No change from baseline              Pertinent Vitals/Pain Pain Assessment: 0-10 Pain Score: 10-Worst pain ever Pain Location: back Pain Descriptors / Indicators: Sore;Discomfort Pain Intervention(s): Limited activity within patient's tolerance;Monitored during session;Premedicated before session     Hand Dominance Right   Extremity/Trunk Assessment Upper Extremity Assessment Upper Extremity Assessment: Overall WFL for tasks assessed   Lower Extremity Assessment Lower Extremity Assessment: Defer to PT evaluation RLE Deficits / Details: able to march, LAQ, ankle DF/PF, official MMT deferred due to elevated pain RLE Sensation: WNL RLE Coordination: WNL LLE Deficits / Details: able to march, LAQ,  ankle DF/PF, official MMT deferred due to elevated pain LLE Sensation: WNL LLE Coordination: WNL   Cervical / Trunk Assessment Cervical / Trunk Assessment:  (two JP drains noted)   Communication Communication Communication: No difficulties   Cognition Arousal/Alertness: Awake/alert Behavior During Therapy: WFL for tasks  assessed/performed Overall Cognitive Status: Within Functional Limits for tasks assessed                                                Home Living Family/patient expects to be discharged to:: Private residence Living Arrangements: Spouse/significant other Available Help at Discharge: Family Type of Home: House Home Access: Stairs to enter Technical brewer of Steps: 2 Entrance Stairs-Rails: Right Home Layout: One level     Bathroom Shower/Tub: Walk-in shower;Door   Bathroom Toilet: Handicapped height     Home Equipment: Grab bars - tub/shower;Shower seat - built in          Prior Functioning/Environment Level of Independence: Independent        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        OT Problem List: Decreased strength;Impaired balance (sitting and/or standing);Pain;Decreased knowledge of precautions;Decreased safety awareness;Decreased activity tolerance;Decreased coordination;Decreased knowledge of use of DME or AE;Impaired sensation      OT Treatment/Interventions: Self-care/ADL training;Manual therapy;Therapeutic exercise;Patient/family education;Balance training;Energy conservation;Therapeutic activities;DME and/or AE instruction;Cognitive remediation/compensation    OT Goals(Current goals can be found in the care plan section) Acute Rehab OT Goals Patient Stated Goal: to decrease pain OT Goal Formulation: With patient/family Time For Goal Achievement: 05/12/21 Potential to Achieve Goals: Good ADL Goals Pt Will Perform Upper Body Dressing: standing;with modified independence Pt Will Perform Lower Body Dressing: with modified independence;sit to/from stand;with adaptive equipment Pt Will Transfer to Toilet: with modified independence;ambulating Pt Will Perform Toileting - Clothing Manipulation and hygiene: with modified independence;sit to/from stand  OT Frequency: Min 2X/week   Barriers to D/C:    none known at this time.           AM-PAC OT "6 Clicks" Daily Activity     Outcome Measure Help from another person eating meals?: None Help from another person taking care of personal grooming?: A Little Help from another person toileting, which includes using toliet, bedpan, or urinal?: A Little Help from another person bathing (including washing, rinsing, drying)?: A Little Help from another person to put on and taking off regular upper body clothing?: A Little Help from another person to put on and taking off regular lower body clothing?: A Little 6 Click Score: 19   End of Session Nurse Communication: Mobility status  Activity Tolerance: Patient tolerated treatment well Patient left: in chair;with call bell/phone within reach;with chair alarm set;with family/visitor present  OT Visit Diagnosis: Unsteadiness on feet (R26.81);Muscle weakness (generalized) (M62.81);Pain Pain - part of body:  (back)                Time: 2956-2130 OT Time Calculation (min): 20 min Charges:  OT General Charges $OT Visit: 1 Visit OT Evaluation $OT Eval Low Complexity: 1 Low OT Treatments $Self Care/Home Management : 8-22 mins  Darleen Crocker, MS, OTR/L , CBIS ascom [phone]  04/28/21, 11:17 AM

## 2021-04-28 NOTE — Evaluation (Signed)
Physical Therapy Evaluation Patient Details Name: Douglas Vega MRN: [id number] DOB: [birth year] Today's Date: 04/28/2021   History of Present Illness  Pt is a 55 yo male s/p L4-S1 interbody fusion PMH of GERD, HTN, HLD, etoh abuse, sleep apnea.  Clinical Impression  Pt A&Ox4, reported 8/20 back pain at end of session, premedicated prior to mobility and RN notified of pt status and request for medicine. 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 Lives with his wife who can assist as needed at discharge.  The patient was instructed in log rolling technique for bed mobility, CGA-minA to complete, reliant on bed rails. Sit <> stand several times, educated on hand placement on RW for safety with good follow through. He was able to ambulate ~81ft with RW, CGA and close chair follow for safety. He exhibited decreased LE weight bearing that improved over time with cueing and encouragement though still reliant on UE support. Pt also educated on BLT precautions.  Overall the patient demonstrated deficits (see "PT Problem List") that impede the patient's functional abilities, safety, and mobility and would benefit from skilled PT intervention. Recommendation is HHPT/follw surgeon's recommendation for DC plan and follow up therapies.      Follow Up Recommendations Home health PT;Follow surgeon's recommendation for DC plan and follow-up therapies;Supervision/Assistance - 24 hour    Equipment Recommendations  Rolling walker with 5" wheels    Recommendations for Other Services       Precautions / Restrictions Precautions Precautions: Fall;Back Precaution Booklet Issued: No Required Braces or Orthoses: Spinal Brace Spinal Brace: Other (comment);Applied in sitting position Spinal Brace Comments: LSO Restrictions Weight Bearing Restrictions: No      Mobility  Bed Mobility Overal bed mobility: Needs Assistance Bed Mobility: Rolling;Sidelying  to Sit Rolling: Min guard Sidelying to sit: Min assist       General bed mobility comments: reliance on bed rails, minA for trunk elevation, verbal cueing for log roll technique    Transfers Overall transfer level: Needs assistance Equipment used: Rolling walker (2 wheeled) Transfers: Sit to/from Stand Sit to Stand: Min guard;Min assist         General transfer comment: MinA for initial standing balance first transfer, CGA for remainder. Cued for safe hand placement  Ambulation/Gait Ambulation/Gait assistance: Min guard Gait Distance (Feet): 75 Feet Assistive device: Rolling walker (2 wheeled)   Gait velocity: decreased   General Gait Details: Pt with decreased LE weight bearing, reliant on UE support. Able to progress weight bearing in legs with prompting and over time  Stairs            Wheelchair Mobility    Modified Rankin (Stroke Patients Only)       Balance Overall balance assessment: Needs assistance Sitting-balance support: Feet supported Sitting balance-Leahy Scale: Good Sitting balance - Comments: limited due to pain     Standing balance-Leahy Scale: Poor Standing balance comment: did have a few instances of decreased UE support with ambulation, but primarily reliant on UEs                             Pertinent Vitals/Pain Pain Assessment: 0-10 Pain Score: 8  Pain Location: pt reported 8/10 at end of session Pain Descriptors / Indicators: Sore;Other (Comment) ("hurts") Pain Intervention(s): Limited activity within patient's tolerance;Monitored during session;Repositioned;Premedicated before session;Patient requesting pain meds-RN notified    Home Living Family/patient expects to be  discharged to:: Private residence Living Arrangements: Spouse/significant other Available Help at Discharge: Family Type of Home: House Home Access: Stairs to enter Entrance Stairs-Rails: Right Entrance Stairs-Number of Steps: 2 Home Layout: One  level [place];Shower seat - built in      Prior Function Level of Independence: Independent         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, full time     Journalist, newspaper        Extremity/Trunk Assessment   Upper Extremity Assessment Upper Extremity Assessment: Overall WFL for tasks assessed    Lower Extremity Assessment Lower Extremity Assessment: RLE deficits/detail;LLE deficits/detail RLE Deficits / Details: able to march, LAQ, ankle DF/PF, official MMT deferred due to elevated pain RLE Sensation: WNL RLE Coordination: WNL LLE Deficits / Details: able to march, LAQ, ankle DF/PF, official MMT deferred due to elevated pain LLE Sensation: WNL LLE Coordination: WNL    Cervical / Trunk Assessment Cervical / Trunk Assessment:  (two JP drains noted)  Communication   Communication: No difficulties  Cognition Arousal/Alertness: Awake/alert Behavior During Therapy: WFL for tasks assessed/performed Overall Cognitive Status: Within Functional Limits for tasks assessed                                        General Comments      Exercises     Assessment/Plan    PT Assessment Patient needs continued PT services  PT Problem List Decreased strength;Decreased mobility;Decreased activity tolerance;Decreased balance;Pain;Decreased range of motion;Decreased knowledge of precautions;Decreased knowledge of use of DME       PT Treatment Interventions DME instruction;Therapeutic exercise;Gait training;Balance training;Stair training;Neuromuscular re-education;Functional mobility training;Therapeutic activities;Patient/family education    PT Goals (Current goals can be found in the Care Plan section)  Acute Rehab PT Goals Patient Stated Goal: to decrease pain PT Goal Formulation: With patient Time For Goal Achievement: 05/12/21 Potential to Achieve Goals: Good    Frequency BID   Barriers to discharge         Co-evaluation               AM-PAC PT "6 Clicks" Mobility  Outcome Measure Help needed turning from your back to your side while in a flat bed without using bedrails?: A Little Help needed moving from lying on your back to sitting on the side of a flat bed without using bedrails?: A Little Help needed moving to and from a bed to a chair (including a wheelchair)?: A Little Help needed standing up from a chair using your arms (e.g., wheelchair or bedside chair)?: A Little Help needed to walk in hospital room?: A Little Help needed climbing 3-5 steps with a railing? : A Little 6 Click Score: 18    End of Session Equipment Utilized During Treatment: Gait belt Activity Tolerance: Patient tolerated treatment well;Patient limited by pain Patient left: in chair;with call bell/phone within reach;with family/visitor present Nurse Communication: Mobility status PT Visit Diagnosis: Muscle weakness (generalized) (M62.81);Pain;Difficulty in walking, not elsewhere classified (R26.2);Other abnormalities of gait and mobility (R26.89) Pain - Right/Left:  (midline) Pain - part of body:  (low back)    Time: 5631-4970 PT Time Calculation (min) (ACUTE ONLY): 42 min   Charges:   PT Evaluation $PT Eval Low Complexity: 1 Low PT Treatments $Gait Training: 8-22 mins $Therapeutic Exercise: 8-22 mins        Beverlee Nims  Megan Salon PT, DPT 10:45 AM,04/28/21

## 2021-04-28 NOTE — Plan of Care (Signed)

## 2021-04-28 NOTE — Op Note (Signed)
Operative Note    SURGERY DATE: 04/27/2021   PRE-OP DIAGNOSIS: Lumbar Stenosis with radiculopathy   POST-OP DIAGNOSIS: Post-Op Diagnosis Codes:    Lumbar stenosis with radiculopathy   Procedure(s): 1. L4/5 TLIF 2. L5/S1 TLIF 3. Arthrodesis and Rod Fixation   SURGEON: Surgeon(s) and Role:    * Malen Gauze, MD - Primary       Liliane Bade, [place] - Assistant    ANESTHESIA: General    OPERATIVE FINDINGS: Stenosis  at L4/5  and L5-S1   OPERATIVE REPORT:   Indication: Douglas Vega presented to the clinic on 4/19 with ongoing bilateral leg pain and back pain . He was found on imaging to have severe stenosis on the left at L4-5 and on the right at L5-S1 with more foraminal stenosis.  There was a collapse of the disc base at L4-5 as well as facet hypertrophy at the L5-S1 level..  He had failed conservative management including OTC medications, precription medications, injections, and physical therapy. The risks of surgery were explained to include hematoma, infection, damage to nerve roots, CSF leak, weakness, numbness, pain, failure of fusion, need for future surgery, heart attack, and stroke.  He elected to proceed with surgery for symptom relief.    Procedure The patient was brought to the OR after informed consent was obtained. He was given general anesthesia and intubated by the anesthesia service. Vascular access lines and a foley catheter were placed. Neuromonitoring electrodes were placed for EMG in lower extremities. The patient was then placed prone on a Jackson table ensuring all pressure points were padded. A time-out was performed per protocol.    The patient was sterilely prepped and draped. The fluoroscopy was used to identify the L4/5 and L5-S1 level with pedicles marked. Bilateral incisions approximately 3 cm off midline were planned local anesthetic with epinephrine was instilled into the planned incisions.  The skin was opened sharply and the dissection taken to the fascia.  This was incised with cautery. The Jamshidi cannulator was placed on the lateral portion of the left L4 pedicle. It was advanced while monitoring for nerve irritation. Once to 3cm mark with stimulation remaining over 70mA, the inner canula was removed and the K wire placed. Fluoroscopy confirmed good placement within pedicle and Jamshidi removed. A tapping screw was used advancing down the k-wire while stimulating. This same procedure was performed at L5 and S1 on the left and then at L4, L5 and S1 on the right.    Next, a 7.67mm x 45 mm screw was placed at each level, using fluoroscopy to ensure adequate trajectories and monitoring with stimulation for any signs of breach. The K-wire was removed as the screw was advanced. Fluoroscopy confirmed adequate dept of screws. On the left at L4-5, the screws had the MAS-TLIF retractors attached and the retractor was placed and medial/lateral retractors to expose the facets at the L4-L5 level on the left.  The tissue overlying the L4 lamina and facets was removed and then a high speed drill used to remove the bone in order to open the L4 neuroforamen. The nerve root was clearly identified. The disc space was then localized and cautery used to remove overlying vessels and then entered sharply. A combination of shavers and rasps were used to remove the disc and prepare the endplates. Once the disc was cleared, a 7-10 mm expandable lordotic cage was placed ensuring that it rested anteriorly and in the midline to promote maximum arthrodesis. Fluoroscopy was used to confirm placement. Bone  graft was then placed within the disc space.   The retractor system was then removed   Next, the retractor was placed on the right at the L5-S1 where the screws had been placed.  A similar procedure was performed here where the tissue overlying the L5 lamina and the associated facets was removed.  Drill was used to perform bone removal including the facetectomy at that level decompressing  the L5 neuroforamen on the right.  Once the nerve root was identified, the disc base was localized and cauterization used to remove the overlying vessels.  This space was entered sharply and a combination of shavers and rasp used to remove the disc.  Once the endplate preparation was complete, a 17 mm expandable lordotic cage was placed ensuring that it rested anteriorly to promote arthrodesis.  Fluoroscopy was used to confirm placement.  Bone graft was then placed within the space.  The retractor system was removed.   Next, a 60 mm rod was placed on left and a 60 mm rod on the right and set screws were placed and torqued. Fluoroscopy confirmed good placement of the rods and adequate length.  Final fluoroscopic images were obtained.  The wounds were irrigated with saline and hemostasis was achieved.  Drains were placed bilaterally and beneath the fascia exiting the skin.  Vancomycin powder was placed.  The fascia was closed with 0 vicryl.  Next, multiple subcutaneous and dermal layers were closed with 2-0 vicryl until the epidermis was well approximated. The skin was closed with adhesive.    The patient was returned to supine position and extubated by the anesthesia service. The patient was then taken to the PACU for post-operative care where he was moving extremities symmetrically. I discussed the results with the family and all questions were answered.        ESTIMATED BLOOD LOSS:    300 mL        IMPLANT  PUTTY PROPEL LRG - [id number]  Inventory Item: PUTTY PROPEL LRG Serial no.:  Model/Cat no.: [id number]  Implant name: Knox Royalty LRG - [id number] Laterality: N/A Area: Spine Lumbar  Manufacturer: [employer] Date of Manufacture:    Action: Implanted Number Used: 1   Device Identifier:  Device Identifier Type:     TLX20 IMPLANT [id number] 20D - [id number]  Inventory Item: [id number] IMPLANT [id number] 20D Serial no.:  Model/Cat no.: [id number]  Implant name: TLX20 IMPLANT [id number] 20D - [id number]  Laterality: N/A Area: Spine Lumbar  Manufacturer: [employer] Date of Manufacture:    Action: Implanted Number Used: 1   Device Identifier:  Device Identifier Type:     SCREW RED RELINE 7.5X45MM POLY - [id number]  Inventory Item: SCREW RED RELINE 7.5X45MM POLY Serial no.:  Model/Cat no.: [id number]  Implant name: SCREW RED RELINE 7.5X45MM POLY - [id number] Laterality: N/A Area: Spine Lumbar  Manufacturer: [employer] Date of Manufacture:    Action: Implanted Number Used: 2   Device Identifier:  Device Identifier Type:     SCREW SHANK RELINE MOD 7.5X45 - [id number]  Inventory Item: SCREW SHANK RELINE MOD 7.5X45 Serial no.:  Model/Cat no.: [id number]  Implant name: Nydia Bouton RELINE MOD 7.5X45 - [id number] Laterality: N/A Area: Spine Lumbar  Manufacturer: [employer] Date of Manufacture:    Action: Implanted Number Used: 4   Device Identifier:  Device Identifier Type:     REDUCTION EXT RELINE MAS MOD - [id number]  Inventory Item: REDUCTION EXT RELINE MAS MOD Serial no.:  Model/Cat no.: [id number]  Implant name: REDUCTION  EXT RELINE MAS MOD - [id number] Laterality: N/A Area: Spine Lumbar  Manufacturer: [employer] Date of Manufacture:    Action: Implanted Number Used: 4   Device Identifier:  Device Identifier Type:     TLX20 IMPLANT [id number] 20D - [id number]  Inventory Item: TLX20 IMPLANT [id number] 20D Serial no.:  Model/Cat no.: [id number] [id number]  Implant name: TLX20 IMPLANT [id number] 20D - [id number] Laterality: N/A Area: Spine Lumbar  Manufacturer: [employer] Date of Manufacture: 03/25/2021   Action: Implanted Number Used: 1   Device Identifier:  Device Identifier Type:     SCREW LOCK RELINE 5.5 TULIP - [id number]  Inventory Item: SCREW LOCK RELINE 5.5 TULIP Serial no.:  Model/Cat no.: [id number]  Implant name: SCREW LOCK RELINE 5.5 TULIP - [id number] Laterality: N/A Area: Spine Lumbar  Manufacturer: [employer] Date of Manufacture:    Action: Implanted Number Used: 6   Device Identifier:  Device  Identifier Type:     TLX20 IMPLANT [id number] 20D - [id number]  Inventory Item: TLX20 IMPLANT [id number] 20D Serial no.:  Model/Cat no.: [id number] [id number]  Implant name: TLX20 IMPLANT [id number] 20D - [id number] Laterality: N/A Area: Spine Lumbar  Manufacturer: [employer] Date of Manufacture:    Action: Implanted Number Used: 1   Device Identifier:  Device Identifier Type:      Procedures performed by myself with the assistant Liliane Bade, [place], [place] [phone]

## 2021-04-28 NOTE — TOC Progression Note (Signed)
Transition of Care [facility]) - Progression Note    Patient Details  Name: Douglas Vega MRN: [id number] Date of Birth: 1966/02/28  [facility] of [facility]) CM/SW Contact  Su Hilt, RN Phone Number: 04/28/2021, 3:47 PM  Clinical Narrative:    Lives at home with his wife, has transportation with his wife, has a RW and is agreeable to a 3 in 1,adapt to bring in to the room to take home, can afford his meds He does not want HH but wants to do it on his own,        Expected Discharge Plan and Services                                                 Social Determinants of Health (SDOH) Interventions    Readmission Risk Interventions No flowsheet data found.

## 2021-04-28 NOTE — Progress Notes (Signed)
Physical Therapy Treatment Patient Details Name: Douglas Vega [place] MRN: [id number] DOB: [birth year] Today's Date: 04/28/2021    History of Present Illness Pt is a 55 yo male s/p L4-S1 interbody fusion PMH of GERD, HTN, HLD, etoh abuse, sleep apnea.    PT Comments    Pt easily wakes at start of session, reported 3/10 at rest, 8/10 after mobility in regards to his low back pain. The patient demonstrated good progress in mobility this PM. Able to recall log rolling technique to transition to EOB, minA for complete trunk elevation (though less assist than this AM). Sit <> stand with RW and CGA. He was able to ambulate ~164ft with RW and CGA, demonstrated improved cadence and LE weight bearing over time. Several pt led standing rest breaks due to pain. The patient was able to recall his precautions as BLT. Brace doffed once sitting EOB in preparation to return to supine. Pt in bed with all needs in reach. The patient would benefit from further skilled PT intervention to continue to progress towards goals. Recommendation remains appropriate.     Follow Up Recommendations  Home health PT;Follow surgeon's recommendation for DC plan and follow-up therapies;Supervision/Assistance - 24 hour     Equipment Recommendations  Rolling walker with 5" wheels    Recommendations for Other Services       Precautions / Restrictions Precautions Precautions: Fall;Back Precaution Booklet Issued: Yes (comment) Required Braces or Orthoses: Spinal Brace Spinal Brace: Other (comment);Applied in sitting position Spinal Brace Comments: LSO Restrictions Weight Bearing Restrictions: No    Mobility  Bed Mobility Overal bed mobility: Needs Assistance Bed Mobility: Rolling;Sidelying to Sit;Sit to Sidelying Rolling: Min guard Sidelying to sit: Min assist     Sit to sidelying: Min guard General bed mobility comments: verbal and tactile cues for log roll technique    Transfers Overall transfer level: Needs  assistance Equipment used: Rolling walker (2 wheeled) Transfers: Sit to/from Stand Sit to Stand: Min guard         General transfer comment: pt with improved independence this session  Ambulation/Gait Ambulation/Gait assistance: Min guard Gait Distance (Feet): 150 Feet Assistive device: Rolling walker (2 wheeled)       General Gait Details: pt with improved LE weight bearing this PM, still reliant on UE. a few standing rest breaks led by patient. improved gait velocity, though still decreased   Stairs             Wheelchair Mobility    Modified Rankin (Stroke Patients Only)       Balance Overall balance assessment: Needs assistance Sitting-balance support: Feet supported Sitting balance-Leahy Scale: Good       Standing balance-Leahy Scale: Fair Standing balance comment: able to statically stand without  relying on UE, BUE for all dynamic tasks                            Cognition Arousal/Alertness: Awake/alert Behavior During Therapy: WFL for tasks assessed/performed Overall Cognitive Status: Within Functional Limits for tasks assessed                                        Exercises Other Exercises Other Exercises: Pt able to recall BLTs    General Comments        Pertinent Vitals/Pain Pain Assessment: 0-10 Pain Score: 8  Pain Descriptors / Indicators: Sore;Discomfort;Guarding;Aching;Grimacing;Moaning Pain Intervention(s):  Limited activity within patient's tolerance;Monitored during session;Repositioned    Home Living                      Prior Function            PT Goals (current goals can now be found in the care plan section) Progress towards PT goals: Progressing toward goals    Frequency    BID      PT Plan Current plan remains appropriate    Co-evaluation              AM-PAC PT "6 Clicks" Mobility   Outcome Measure  Help needed turning from your back to your side while in a  flat bed without using bedrails?: A Little Help needed moving from lying on your back to sitting on the side of a flat bed without using bedrails?: A Little Help needed moving to and from a bed to a chair (including a wheelchair)?: A Little Help needed standing up from a chair using your arms (e.g., wheelchair or bedside chair)?: A Little Help needed to walk in hospital room?: A Little Help needed climbing 3-5 steps with a railing? : A Little 6 Click Score: 18    End of Session Equipment Utilized During Treatment: Gait belt Activity Tolerance: Patient tolerated treatment well Patient left: in bed;with call bell/phone within reach;with bed alarm set Nurse Communication: Mobility status PT Visit Diagnosis: Muscle weakness (generalized) (M62.81);Pain;Difficulty in walking, not elsewhere classified (R26.2);Other abnormalities of gait and mobility (R26.89) Pain - Right/Left:  (midline) Pain - part of body:  (low back)     Time: 3212-2482 PT Time Calculation (min) (ACUTE ONLY): 24 min  Charges:  $Therapeutic Exercise: 23-37 mins                    Lieutenant Diego PT, DPT 3:33 PM,04/28/21

## 2021-04-29 MED ORDER — METHOCARBAMOL 500 MG PO TABS: 500.0000 mg | ORAL_TABLET | Freq: Four times a day (QID) | ORAL | 0 refills | Status: AC | PRN

## 2021-04-29 MED ORDER — ACETAMINOPHEN 325 MG PO TABS: 650.0000 mg | ORAL_TABLET | ORAL | Status: AC | PRN

## 2021-04-29 MED ORDER — OXYCODONE HCL 5 MG PO TABS: 5.0000 mg | ORAL_TABLET | ORAL | 0 refills | Status: AC | PRN

## 2021-04-29 NOTE — Progress Notes (Signed)
Physical Therapy Treatment Patient Details Name: Douglas Vega MRN: [id number] DOB: May 31, 1966 Today's Date: 04/29/2021    History of Present Illness Pt is a 55 yo male s/p L4-S1 interbody fusion PMH of GERD, HTN, HLD, etoh abuse, sleep apnea.    PT Comments    Patient alert, agreeable to PT. Reported 3-4/10 in low back pain, did state it increased with mobility. He was able to performed log roll technique minimal verbal cueing, and very light minA for pt comfort due to pt for trunk elevation. Sit <> stand with RW and CGA, improved upright tolerance and activity tolerance noted this session. He ambulated ~147ft with RW and CGA. Several standing rest breaks led by patient, but improved LE weight bearing compared to previous sessions. Stair navigation performed; several techniques, pt/family verbalized understanding. Car transfers also reviewed, and exercise handouts provided as well. Pt in room up in chair, no further questions. The patient would benefit from further skilled PT intervention to continue to progress towards goals. Recommendation remains appropriate.       Follow Up Recommendations  Home health PT;Follow surgeon's recommendation for DC plan and follow-up therapies;Supervision/Assistance - 24 hour     Equipment Recommendations  3in1 (PT);Other (comment) (pt reported that he has a RW at home he can use)    Recommendations for Other Services       Precautions / Restrictions Precautions Precautions: Fall;Back Precaution Booklet Issued: No Required Braces or Orthoses: Spinal Brace Spinal Brace: Other (comment);Applied in sitting position Spinal Brace Comments: LSO Restrictions Weight Bearing Restrictions: No    Mobility  Bed Mobility Overal bed mobility: Needs Assistance Bed Mobility: Rolling;Sidelying to Sit;Sit to Sidelying Rolling: Min guard Sidelying to sit: Min guard;Min assist       General bed mobility comments: very very light assist for trunk to sit up,  for pt comfort    Transfers Overall transfer level: Needs assistance Equipment used: Rolling walker (2 wheeled) Transfers: Sit to/from Stand Sit to Stand: Min guard            Ambulation/Gait Ambulation/Gait assistance: Min guard Gait Distance (Feet): 170 Feet Assistive device: Rolling walker (2 wheeled)   Gait velocity: decreased   General Gait Details: pt with improved LE weight bearing, still reliant on UE. a few standing rest breaks led by patient. improved gait velocity, though still decreased   Stairs             Wheelchair Mobility    Modified Rankin (Stroke Patients Only)       Balance Overall balance assessment: Needs assistance Sitting-balance support: Feet supported Sitting balance-Leahy Scale: Good Sitting balance - Comments: limited due to pain     Standing balance-Leahy Scale: Fair Standing balance comment: able to statically stand without  relying on UE, BUE for all dynamic tasks                            Cognition Arousal/Alertness: Awake/alert Behavior During Therapy: WFL for tasks assessed/performed Overall Cognitive Status: Within Functional Limits for tasks assessed                                        Exercises      General Comments        Pertinent Vitals/Pain Pain Assessment: 0-10 Pain Score: 4  Pain Location: back Pain Descriptors / Indicators: Sore;Discomfort;Guarding;Aching;Grimacing;Moaning Pain Intervention(s): Limited  activity within patient's tolerance;Monitored during session;Repositioned;Premedicated before session    Home Living                      Prior Function            PT Goals (current goals can now be found in the care plan section) Progress towards PT goals: Progressing toward goals    Frequency    BID      PT Plan Current plan remains appropriate    Co-evaluation              AM-PAC PT "6 Clicks" Mobility   Outcome Measure  Help needed  turning from your back to your side while in a flat bed without using bedrails?: A Little Help needed moving from lying on your back to sitting on the side of a flat bed without using bedrails?: A Little Help needed moving to and from a bed to a chair (including a wheelchair)?: A Little Help needed standing up from a chair using your arms (e.g., wheelchair or bedside chair)?: A Little Help needed to walk in hospital room?: A Little Help needed climbing 3-5 steps with a railing? : A Little 6 Click Score: 18    End of Session Equipment Utilized During Treatment: Gait belt Activity Tolerance: Patient tolerated treatment well Patient left: with call bell/phone within reach;in chair;with family/visitor present Nurse Communication: Mobility status PT Visit Diagnosis: Muscle weakness (generalized) (M62.81);Pain;Difficulty in walking, not elsewhere classified (R26.2);Other abnormalities of gait and mobility (R26.89) Pain - Right/Left:  (midline) Pain - part of body:  (low back)     Time: 9292-4462 PT Time Calculation (min) (ACUTE ONLY): 41 min  Charges:  $Gait Training: 8-22 mins $Therapeutic Exercise: 23-37 mins                     Lieutenant Diego PT, DPT 10:20 AM,04/29/21

## 2021-04-29 NOTE — Plan of Care (Signed)

## 2021-04-29 NOTE — Discharge Summary (Signed)
Physician Discharge Summary  Patient ID: Douglas Vega MRN: [id number] DOB/AGE: [birth year] 55 y.o.  Admit date: 04/27/2021 Discharge date: 04/29/2021  Admission Diagnoses: Spinal stenosis of lumbar region with radiculopathy  Discharge Diagnoses:  Active Problems:   Spinal stenosis of lumbar region with radiculopathy   Discharged Condition: good  Hospital Course: [name] is a 55 yo male who presented with spinal stenosis of the lumbar region with radiculopathy.  He underwent surgery and was admitted posteroperatively.  He did well and was discharged on POD2.   Consults: None  Significant Diagnostic Studies: radiology: X-Ray: post-op xrays showing good placement of implants  Treatments: surgery: L4-S1 TLIF   Discharge Exam: Blood pressure 108/65, pulse (!) 59, temperature 98.3 F (36.8 C), resp. rate 16, SpO2 94 %. General appearance: alert and cooperative  CNI 5/5 BLE Incision c/d/i  Disposition: Discharge disposition: 01-Home or Self Care       Discharge Instructions    Incentive spirometry RT   Complete by: As directed      Allergies as of 04/29/2021   No Known Allergies     Medication List    TAKE these medications   acetaminophen 325 MG tablet Commonly known as: TYLENOL Take 2 tablets (650 mg total) by mouth every 4 (four) hours as needed for mild pain ((score 1 to 3) or temp > 100.5).   aspirin EC 81 MG tablet Take 81 mg by mouth in the morning.   b complex vitamins capsule 1 capsule in the morning.   cholecalciferol 25 MCG (1000 UNIT) tablet Commonly known as: VITAMIN D3 Take 1,000 Units by mouth in the morning.   magnesium oxide 400 MG tablet Commonly known as: MAG-OX Take 400 mg by mouth in the morning.   methocarbamol 500 MG tablet Commonly known as: ROBAXIN Take 1 tablet (500 mg total) by mouth every 6 (six) hours as needed for muscle spasms.   mometasone 0.1 % cream Commonly known as: ELOCON Apply 1 application topically daily. X 2  weeks then decrease to 5 times per week   omeprazole 20 MG capsule Commonly known as: PRILOSEC Take 20 mg by mouth in the morning.   OVER THE COUNTER MEDICATION Take 4 capsules by mouth in the morning and at bedtime. Relief Factor   oxyCODONE 5 MG immediate release tablet Commonly known as: Oxy IR/ROXICODONE Take 1-2 tablets (5-10 mg total) by mouth every 4 (four) hours as needed for up to 7 days for moderate pain ((score 4 to 6)).   valsartan-hydrochlorothiazide 80-12.5 MG tablet Commonly known as: DIOVAN-HCT Take 1 tablet by mouth in the morning.       Follow-up Information    Meade Maw, MD Follow up in 2 week(s).   Specialty: Neurosurgery Contact information: [place] [address] [phone]               Signed: Meade Maw 04/29/2021, 7:23 AM

## 2021-04-29 NOTE — Progress Notes (Signed)
   Progress Note   Date: 04/29/2021  HPI: Mr Lamia is admitted after L4-S1 fusion and decompression for lumbar radiculopathy and back pain  POD#2: Doing well, voiding on own.  Walked in hallways.  POD#1: Pain in back being controlled with medication. No leg pain endorsed. His foley catheter is removed and he is voiding. He has not ambulated yet. LSO brace is in room.   Vital Signs: Temp:  [97.4 F (36.3 C)-98.9 F (37.2 C)] 98.3 F (36.8 C) (06/08 0446) Pulse Rate:  [57-62] 59 (06/08 0446) Resp:  [16-18] 16 (06/08 0446) BP: (108-133)/(65-84) 108/65 (06/08 0446) SpO2:  [94 %-100 %] 94 % (06/08 0446) Temp (24hrs), Avg:98.1 F (36.7 C), Min:97.4 F (36.3 C), Max:98.9 F (37.2 C)    Problem List Patient Active Problem List   Diagnosis Date Noted  . Spinal stenosis of lumbar region with radiculopathy 04/27/2021    Medications: Scheduled Meds: . cholecalciferol  1,000 Units Oral q AM  . enoxaparin (LOVENOX) injection  40 mg Subcutaneous Q24H  . hydrochlorothiazide  12.5 mg Oral Daily  . irbesartan  75 mg Oral Daily  . magnesium oxide  400 mg Oral q AM  . pantoprazole  40 mg Oral Daily  . senna  1 tablet Oral BID  . sodium chloride flush  3 mL Intravenous Q12H   Continuous Infusions: . sodium chloride    . sodium chloride    . methocarbamol (ROBAXIN) IV Stopped (04/27/21 2203)   PRN Meds:.acetaminophen **OR** acetaminophen, HYDROmorphone (DILAUDID) injection, menthol-cetylpyridinium **OR** phenol, methocarbamol **OR** methocarbamol (ROBAXIN) IV, ondansetron **OR** ondansetron (ZOFRAN) IV, oxyCODONE, oxyCODONE, sodium chloride flush  Labs:  Lab Results  Component Value Date   WBC 8.7 04/27/2021   WBC 4.7 04/16/2021   HCT 37.6 (L) 04/27/2021   HCT 41.7 04/16/2021   PLT 191 04/27/2021   PLT 235 04/16/2021    Lab Results  Component Value Date   INR 1.0 04/16/2021   APTT 34 04/16/2021    Lab Results  Component Value Date   NA 140 04/16/2021   NA 136  02/26/2008   K 3.7 04/16/2021   K 4.2 02/26/2008   BUN 15 04/16/2021   BUN 5 (L) 02/26/2008   No results found for: MG  Exam: Incisions flat, clean JP drains in place Left: 30 cc out Right: 10 cc out  Strength 5/5 throughout bilateral lower extremities Sensation intact in lower extremities  A/P Mr Sheek is POD#2 from L4-S1 TLIF  - Ambulate with PT/OT - LSO brace when out of bed - Pain control with oral meds - DVT ppx  Meade Maw, MD

## 2021-04-29 NOTE — Progress Notes (Signed)
Pt discharged home with wife, all instructions provided with questions answered.   BP 108/70 (BP Location: Left Arm)   Pulse 64   Temp 97.9 F (36.6 C) (Oral)   Resp 16   SpO2 97%

## 2021-05-20 ENCOUNTER — Ambulatory Visit: Payer: BC Managed Care – PPO | Admitting: Dermatology

## 2021-05-20 ENCOUNTER — Other Ambulatory Visit: Payer: Self-pay

## 2021-05-20 DIAGNOSIS — L578 Other skin changes due to chronic exposure to nonionizing radiation: Secondary | ICD-10-CM | POA: Diagnosis not present

## 2021-05-20 DIAGNOSIS — D485 Neoplasm of uncertain behavior of skin: Secondary | ICD-10-CM

## 2021-05-20 NOTE — Progress Notes (Signed)
   Follow-Up Visit   Subjective  Douglas Vega is a 55 y.o. male who presents for the following: Follow-up (Recheck tip of nose/ Pt states that it has improved but not completely. Pt using opzelura and mometasone 5 days/week. ).  Advised patient to avoid picking at area. Suggested covering with a piece of tape or a band aid when possible.   The following portions of the chart were reviewed this encounter and updated as appropriate:  Tobacco  Allergies  Meds  Problems  Med Hx  Surg Hx  Fam Hx      Review of Systems: No other skin or systemic complaints except as noted in HPI or Assessment and Plan.   Objective  Well appearing patient in no apparent distress; mood and affect are within normal limits.  A focused examination was performed including face. Relevant physical exam findings are noted in the Assessment and Plan.  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  More consistent with [place].   Continue opzelura qd.  Continue mometasone BID 5 days/week.   Cover when able. Avoid touching and picking.  Pt admits he does this, but has done better which has resulted in improvement and was even better until he found himself going back to the habit of picking again.  Discussed that at this time, because of significant improvement, I do not feel he needs a biopsy today.  Related Medications mometasone (ELOCON) 0.1 % cream Apply 1 application topically daily. X 2 weeks then decrease to 5 times per week  Return in about 3 months (around 08/20/2021) for 3-4 month recheck nose, TBSE.  Actinic Damage - chronic, secondary to cumulative UV radiation exposure/sun exposure over time - diffuse scaly erythematous macules with underlying dyspigmentation - Recommend daily broad spectrum sunscreen SPF 30+ to sun-exposed areas, reapply every 2 hours as needed.  - Recommend staying in the shade or wearing long sleeves, sun  glasses (UVA+UVB protection) and wide brim hats (4-inch brim around the entire circumference of the hat). - Call for new or changing lesions.  IHarriett Sine, CMA, am acting as scribe for Sarina Ser, MD.  Documentation: I have reviewed the above documentation for accuracy and completeness, and I agree with the above.  Sarina Ser, MD

## 2021-05-20 NOTE — Patient Instructions (Signed)
Apply Opzelura once a day, 7 days/week  Apply mometasone twice a day, 5 days/week

## 2021-05-27 ENCOUNTER — Encounter: Payer: Self-pay | Admitting: Dermatology

## 2021-09-30 ENCOUNTER — Ambulatory Visit: Payer: BC Managed Care – PPO | Admitting: Dermatology

## 2021-10-22 ENCOUNTER — Other Ambulatory Visit: Payer: Self-pay | Admitting: Dermatology

## 2021-10-22 DIAGNOSIS — D485 Neoplasm of uncertain behavior of skin: Secondary | ICD-10-CM

## 2021-12-11 ENCOUNTER — Other Ambulatory Visit
Admission: RE | Admit: 2021-12-11 | Discharge: 2021-12-11 | Disposition: A | Discharge status: Home / Self Care | Payer: BC Managed Care – PPO | Source: Ambulatory Visit | Attending: Urology | Admitting: Urology

## 2021-12-11 ENCOUNTER — Other Ambulatory Visit: Payer: Self-pay

## 2021-12-11 VITALS — Ht 67.0 in | Wt 200.0 lb

## 2021-12-11 DIAGNOSIS — I10 Essential (primary) hypertension: Secondary | ICD-10-CM

## 2021-12-11 HISTORY — DX: Personal history of urinary calculi: Z87.442

## 2021-12-11 NOTE — Patient Instructions (Signed)
Your procedure is scheduled on: Thursday December 17, 2021. Report to Day Surgery inside [facility] 2nd floor. To find out your arrival time please call [phone] between 1PM - 3PM on Wednesday December 16, 2021.  Remember: Instructions that are not followed completely may result in serious medical risk,  up to and including death, or upon the discretion of your surgeon and anesthesiologist your  surgery may need to be rescheduled.     _X__ 1. Do not eat food after midnight the night before your procedure.                 No chewing gum or hard candies. You may drink clear liquids up to 2 hours                 before you are scheduled to arrive for your surgery- DO not drink clear                 liquids within 2 hours of the start of your surgery.                 Clear Liquids include:  water, Black Coffee or Tea (Do not add                 anything to coffee or tea).  __X__2.  On the morning of surgery brush your teeth with toothpaste and water, you                may rinse your mouth with mouthwash if you wish.  Do not swallow any toothpaste or mouthwash.     _X__ 3.  No Alcohol for 24 hours before or after surgery.   _X__ 4.  Do Not Smoke or use e-cigarettes For 24 Hours Prior to Your Surgery.                 Do not use any chewable tobacco products for at least 6 hours prior to                 Surgery.  _X__  5.  Do not use any recreational drugs (marijuana, cocaine, heroin, ecstasy, MDMA or other)                For at least one week prior to your surgery.  Combination of these drugs with anesthesia                May have life threatening results.  ____  6.  Bring all medications with you on the day of surgery if instructed.   __X__  7.  Notify your doctor if there is any change in your medical condition      (cold, fever, infections).     Do not wear jewelry, make-up, hairpins, clips or nail polish. Do not wear lotions, powders, or perfumes. You  may wear deodorant. Do not shave 48 hours prior to surgery. Men may shave face and neck. Do not bring valuables to the hospital.    [facility] is not responsible for any belongings or valuables.  Contacts, dentures or bridgework may not be worn into surgery. Leave your suitcase in the car. After surgery it may be brought to your room. For patients admitted to the hospital, discharge time is determined by your treatment team.   Patients discharged the day of surgery will not be allowed to drive home.   Make arrangements for someone to be with you for the first 24 hours of your Same  Day Discharge.   __X__ Take these medicines the morning of surgery with A SIP OF WATER:    1. omeprazole (PRILOSEC) 20 MG  2.   3.   4.  5.  6.  ____ Fleet Enema (as directed)   ____ Use CHG Soap (or wipes) as directed  ____ Use Benzoyl Peroxide Gel as instructed  ____ Use inhalers on the day of surgery  ____ Stop metformin 2 days prior to surgery    ____ Take 1/2 of usual insulin dose the night before surgery. No insulin the morning          of surgery.   ____ Call your PCP, cardiologist, or Pulmonologist if taking Coumadin/Plavix/aspirin and ask when to stop before your surgery.   __X__ One Week prior to surgery- Stop Anti-inflammatories such as Ibuprofen, Aleve, Advil, Motrin, meloxicam (MOBIC), diclofenac, etodolac, ketorolac, Toradol, Daypro, piroxicam, Goody's or BC powders. OK TO USE TYLENOL IF NEEDED   __X__ Stop supplements until after surgery.    ____ Bring C-Pap to the hospital.    If you have any questions regarding your pre-procedure instructions,  Please call Pre-admit Testing at [phone]

## 2021-12-15 ENCOUNTER — Other Ambulatory Visit: Payer: Self-pay

## 2021-12-15 ENCOUNTER — Other Ambulatory Visit
Admission: RE | Admit: 2021-12-15 | Discharge: 2021-12-15 | Disposition: A | Discharge status: Home / Self Care | Payer: BC Managed Care – PPO | Source: Ambulatory Visit | Attending: Urology | Admitting: Urology

## 2021-12-15 DIAGNOSIS — I10 Essential (primary) hypertension: Secondary | ICD-10-CM

## 2021-12-15 DIAGNOSIS — Z01812 Encounter for preprocedural laboratory examination: Secondary | ICD-10-CM

## 2021-12-15 NOTE — H&P (Signed)
NAMETOMMEY, BARRET MEDICAL RECORD NO: [id number] ACCOUNT NO: [id number] DATE OF BIRTH: 19-May-1966 FACILITY: [facility] LOCATION: [facility] PHYSICIAN: Otelia Limes. Yves Dill, MD  History and Physical   DATE OF ADMISSION: 12/17/2021  Same day surgery 12/17/2021.  CHIEF COMPLAINT:  Right flank pain.  HISTORY OF PRESENT ILLNESS:  The patient is a 56 year old white male who presented to the office with intermittent right flank pain.  He also has a history of difficulty voiding and urethral stricture disease.  He had an IVP, which revealed a 7 x 8 mm  left lower pole renal stone and an 8 x 16 mm bladder stone and a 6 x 9 mm distal right ureteral stone.  He had moderate postvoid residual.  He has a history of urethral stricture disease and is status post urethroplasty and/or dilatation of the stricture  in 1992.  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  He does have significant lower urinary tract symptoms including incomplete emptying, frequency, intermittency, urgency, weak stream, straining and nocturia.  IPS score 14 with a quality of life score 3.  He comes in now for a cystoscopy with possible  internal urethrotomy versus Optilume, right retrograde pyelography with right ureteroscopic ureterolithotomy with holmium laser and litholapaxy of bladder stone with holmium laser.  PAST MEDICAL HISTORY: ALLERGIES:  No drug allergies.  CURRENT MEDICATIONS:  Valsartan, Prilosec, aspirin, vitamin D, tamsulosin.  PAST AND CURRENT MEDICAL HISTORY: 1.  Hypertension. 2.  GERD.  REVIEW OF SYSTEMS:  The patient denied weight changes, chest pain, shortness of breath, diabetes, stroke or heart disease.  SOCIAL HISTORY:  The patient denies tobacco or alcohol use.  FAMILY HISTORY:  Father died at age 92 with diabetes.  Mother died at age 72 with breast cancer.  There is no family history of urologic disease or urological malignancy.  PHYSICAL EXAMINATION:   VITAL SIGNS:  Weight 200  pounds.  Height 5 feet 7 inches. GENERAL:  Well-nourished white male in no acute distress. HEENT:  Sclerae were clear.  Pupils are equal, round, reactive to light and accommodation.  Extraocular movements are intact. NECK:  No palpable cervical masses or tenderness. LYMPHATIC:  No palpable cervical or inguinal adenopathy. LUNGS:  Clear to auscultation. CARDIOVASCULAR:  Regular rhythm and rate without audible murmurs. ABDOMEN:  Soft and nontender. BACK:  No CVA tenderness. GENITOURINARY:  Circumcised.  Meatus was widely patent consistent with prior meatotomy.  Testes were both smooth and nontender 18 mL size each. RECTAL: A 15 gram, smooth, nontender prostate. NEUROMUSCULAR:  Alert and oriented x3.  IMPRESSION:  1.  Right ureterolithiasis. 2.  Bladder stone. 3.  Probable urethral stricture disease.  PLAN: Cystoscopy with possible Optilume procedure versus internal urethrotomy, right retrograde pyelography, right ureteroscopic ureterolithotomy with holmium laser lithotripsy and litholapaxy of bladder stone with holmium laser.   PUS D: 12/11/2021 2:37:02 pm T: 12/11/2021 2:55:00 pm  JOB: [id number]/ [id number]

## 2021-12-17 ENCOUNTER — Encounter
Admission: RE | Disposition: A | Discharge status: Home / Self Care | Payer: Self-pay | Source: Home / Self Care | Attending: Urology

## 2021-12-17 ENCOUNTER — Encounter: Payer: Self-pay | Admitting: Urology

## 2021-12-17 ENCOUNTER — Ambulatory Visit: Payer: BC Managed Care – PPO

## 2021-12-17 ENCOUNTER — Ambulatory Visit: Payer: BC Managed Care – PPO | Admitting: Urgent Care

## 2021-12-17 ENCOUNTER — Ambulatory Visit
Admission: RE | Admit: 2021-12-17 | Discharge: 2021-12-17 | Disposition: A | Discharge status: Home / Self Care | Payer: BC Managed Care – PPO | Attending: Urology | Admitting: Urology

## 2021-12-17 ENCOUNTER — Other Ambulatory Visit: Payer: Self-pay

## 2021-12-17 DIAGNOSIS — Z9889 Other specified postprocedural states: Secondary | ICD-10-CM | POA: Diagnosis not present

## 2021-12-17 DIAGNOSIS — I10 Essential (primary) hypertension: Secondary | ICD-10-CM | POA: Diagnosis not present

## 2021-12-17 DIAGNOSIS — Z01812 Encounter for preprocedural laboratory examination: Secondary | ICD-10-CM

## 2021-12-17 DIAGNOSIS — Z87442 Personal history of urinary calculi: Secondary | ICD-10-CM | POA: Diagnosis not present

## 2021-12-17 DIAGNOSIS — N323 Diverticulum of bladder: Secondary | ICD-10-CM | POA: Diagnosis not present

## 2021-12-17 DIAGNOSIS — N202 Calculus of kidney with calculus of ureter: Secondary | ICD-10-CM | POA: Diagnosis present

## 2021-12-17 DIAGNOSIS — N210 Calculus in bladder: Secondary | ICD-10-CM | POA: Diagnosis not present

## 2021-12-17 HISTORY — PX: CYSTOSCOPY W/ RETROGRADES: SHX1426

## 2021-12-17 HISTORY — PX: URETEROSCOPY WITH HOLMIUM LASER LITHOTRIPSY: SHX6645

## 2021-12-17 HISTORY — PX: CYSTOSCOPY WITH LITHOLAPAXY: SHX1425

## 2021-12-17 LAB — CBC
HCT: 41.4 % (ref 39.0–52.0)
Hemoglobin: 14.4 g/dL (ref 13.0–17.0)
MCH: 29.5 pg (ref 26.0–34.0)
MCHC: 34.8 g/dL (ref 30.0–36.0)
MCV: 84.8 fL (ref 80.0–100.0)
Platelets: 206 10*3/uL (ref 150–400)
RBC: 4.88 MIL/uL (ref 4.22–5.81)
RDW: 13.1 % (ref 11.5–15.5)
WBC: 3.7 10*3/uL — ABNORMAL LOW (ref 4.0–10.5)
nRBC: 0 % (ref 0.0–0.2)

## 2021-12-17 LAB — BASIC METABOLIC PANEL
Anion gap: 9 (ref 5–15)
BUN: 15 mg/dL (ref 6–20)
CO2: 27 mmol/L (ref 22–32)
Calcium: 9.8 mg/dL (ref 8.9–10.3)
Chloride: 105 mmol/L (ref 98–111)
Creatinine, Ser: 0.91 mg/dL (ref 0.61–1.24)
GFR, Estimated: >60 mL/min (ref >60)
Glucose, Bld: 137 mg/dL — ABNORMAL HIGH (ref 70–99)
Potassium: 4 mmol/L (ref 3.5–5.1)
Sodium: 141 mmol/L (ref 135–145)

## 2021-12-17 SURGERY — URETEROSCOPY, WITH LITHOTRIPSY USING HOLMIUM LASER
Anesthesia: General

## 2021-12-17 MED ORDER — LIDOCAINE HCL URETHRAL/MUCOSAL 2 % EX GEL
CUTANEOUS | Status: AC
  Filled 2021-12-17: qty 10

## 2021-12-17 MED ORDER — KETAMINE HCL 50 MG/5ML IJ SOSY
PREFILLED_SYRINGE | INTRAMUSCULAR | Status: AC
  Filled 2021-12-17: qty 5

## 2021-12-17 MED ORDER — CEFAZOLIN SODIUM-DEXTROSE 1-4 GM/50ML-% IV SOLN
INTRAVENOUS | Status: AC
  Filled 2021-12-17: qty 50

## 2021-12-17 MED ORDER — ACETAMINOPHEN 10 MG/ML IV SOLN: 1000.0000 mg | Freq: Once | INTRAVENOUS | Status: DC | PRN

## 2021-12-17 MED ORDER — ROCURONIUM BROMIDE 100 MG/10ML IV SOLN
INTRAVENOUS | Status: DC | PRN
  Administered 2021-12-17: 10 mg via INTRAVENOUS
  Administered 2021-12-17: 50 mg via INTRAVENOUS

## 2021-12-17 MED ORDER — MIDAZOLAM HCL 2 MG/2ML IJ SOLN
INTRAMUSCULAR | Status: AC
  Filled 2021-12-17: qty 2

## 2021-12-17 MED ORDER — EPHEDRINE SULFATE (PRESSORS) 50 MG/ML IJ SOLN
INTRAMUSCULAR | Status: DC | PRN
  Administered 2021-12-17 (×2): 5 mg via INTRAVENOUS

## 2021-12-17 MED ORDER — ONDANSETRON HCL 4 MG/2ML IJ SOLN
INTRAMUSCULAR | Status: AC
  Filled 2021-12-17: qty 2

## 2021-12-17 MED ORDER — ONDANSETRON HCL 4 MG/2ML IJ SOLN
INTRAMUSCULAR | Status: DC | PRN
  Administered 2021-12-17: 4 mg via INTRAVENOUS

## 2021-12-17 MED ORDER — PROPOFOL 10 MG/ML IV BOLUS
INTRAVENOUS | Status: AC
  Filled 2021-12-17: qty 20

## 2021-12-17 MED ORDER — LIDOCAINE HCL (PF) 2 % IJ SOLN
INTRAMUSCULAR | Status: AC
  Filled 2021-12-17: qty 5

## 2021-12-17 MED ORDER — MIDAZOLAM HCL 2 MG/2ML IJ SOLN
INTRAMUSCULAR | Status: DC | PRN
  Administered 2021-12-17: 2 mg via INTRAVENOUS

## 2021-12-17 MED ORDER — IOHEXOL 180 MG/ML  SOLN
INTRAMUSCULAR | Status: DC | PRN
  Administered 2021-12-17: 10 mL

## 2021-12-17 MED ORDER — FENTANYL CITRATE (PF) 100 MCG/2ML IJ SOLN
INTRAMUSCULAR | Status: AC
  Administered 2021-12-17: 50 ug via INTRAVENOUS
  Filled 2021-12-17: qty 2

## 2021-12-17 MED ORDER — WATER FOR IRRIGATION, STERILE IR SOLN
Status: DC | PRN
Start: 2021-12-17 — End: 2021-12-17
  Administered 2021-12-17: 1000 mL

## 2021-12-17 MED ORDER — KETOROLAC TROMETHAMINE 30 MG/ML IJ SOLN
INTRAMUSCULAR | Status: AC
  Filled 2021-12-17: qty 1

## 2021-12-17 MED ORDER — KETAMINE HCL 10 MG/ML IJ SOLN
INTRAMUSCULAR | Status: DC | PRN
  Administered 2021-12-17: 20 mg via INTRAVENOUS

## 2021-12-17 MED ORDER — FENTANYL CITRATE (PF) 100 MCG/2ML IJ SOLN
INTRAMUSCULAR | Status: DC | PRN
  Administered 2021-12-17: 100 ug via INTRAVENOUS

## 2021-12-17 MED ORDER — OXYCODONE HCL 5 MG/5ML PO SOLN: 5.0000 mg | Freq: Once | ORAL | Status: DC | PRN

## 2021-12-17 MED ORDER — CIPROFLOXACIN HCL 500 MG PO TABS: 500.0000 mg | ORAL_TABLET | Freq: Two times a day (BID) | ORAL | 0 refills | Status: AC

## 2021-12-17 MED ORDER — ACETAMINOPHEN 10 MG/ML IV SOLN
INTRAVENOUS | Status: DC | PRN
  Administered 2021-12-17: 1000 mg via INTRAVENOUS

## 2021-12-17 MED ORDER — EPHEDRINE 5 MG/ML INJ
INTRAVENOUS | Status: AC
  Filled 2021-12-17: qty 5

## 2021-12-17 MED ORDER — LIDOCAINE HCL URETHRAL/MUCOSAL 2 % EX GEL
CUTANEOUS | Status: DC | PRN
  Administered 2021-12-17: 1 via URETHRAL

## 2021-12-17 MED ORDER — CHLORHEXIDINE GLUCONATE 0.12 % MT SOLN
OROMUCOSAL | Status: AC
  Administered 2021-12-17: 15 mL via OROMUCOSAL
  Filled 2021-12-17: qty 15

## 2021-12-17 MED ORDER — FENTANYL CITRATE (PF) 100 MCG/2ML IJ SOLN
INTRAMUSCULAR | Status: AC
  Filled 2021-12-17: qty 2

## 2021-12-17 MED ORDER — DEXAMETHASONE SODIUM PHOSPHATE 10 MG/ML IJ SOLN
INTRAMUSCULAR | Status: DC | PRN
  Administered 2021-12-17: 5 mg via INTRAVENOUS

## 2021-12-17 MED ORDER — PHENYLEPHRINE HCL (PRESSORS) 10 MG/ML IV SOLN
INTRAVENOUS | Status: DC | PRN
  Administered 2021-12-17 (×2): 100 ug via INTRAVENOUS

## 2021-12-17 MED ORDER — PROPOFOL 10 MG/ML IV BOLUS
INTRAVENOUS | Status: DC | PRN
Start: 2021-12-17 — End: 2021-12-17
  Administered 2021-12-17: 150 mg via INTRAVENOUS
  Administered 2021-12-17: 50 mg via INTRAVENOUS

## 2021-12-17 MED ORDER — ONDANSETRON HCL 4 MG/2ML IJ SOLN: 4.0000 mg | Freq: Once | INTRAMUSCULAR | Status: DC | PRN

## 2021-12-17 MED ORDER — CEFAZOLIN SODIUM-DEXTROSE 1-4 GM/50ML-% IV SOLN
1.0000 g | Freq: Once | INTRAVENOUS | Status: AC
  Administered 2021-12-17: 2 g via INTRAVENOUS

## 2021-12-17 MED ORDER — LACTATED RINGERS IV SOLN: INTRAVENOUS | Status: DC

## 2021-12-17 MED ORDER — LIDOCAINE HCL (CARDIAC) PF 100 MG/5ML IV SOSY
PREFILLED_SYRINGE | INTRAVENOUS | Status: DC | PRN
Start: 2021-12-17 — End: 2021-12-17
  Administered 2021-12-17: 100 mg via INTRAVENOUS

## 2021-12-17 MED ORDER — URIBEL 118 MG PO CAPS: 1.0000 | ORAL_CAPSULE | Freq: Four times a day (QID) | ORAL | 3 refills | Status: AC | PRN

## 2021-12-17 MED ORDER — DEXAMETHASONE SODIUM PHOSPHATE 10 MG/ML IJ SOLN
INTRAMUSCULAR | Status: AC
  Filled 2021-12-17: qty 1

## 2021-12-17 MED ORDER — SODIUM CHLORIDE 0.9 % IR SOLN
Status: DC | PRN
  Administered 2021-12-17: 6000 mL

## 2021-12-17 MED ORDER — ACETAMINOPHEN 10 MG/ML IV SOLN
INTRAVENOUS | Status: AC
  Filled 2021-12-17: qty 100

## 2021-12-17 MED ORDER — FENTANYL CITRATE (PF) 100 MCG/2ML IJ SOLN
25.0000 ug | INTRAMUSCULAR | Status: DC | PRN
  Administered 2021-12-17: 50 ug via INTRAVENOUS

## 2021-12-17 MED ORDER — ORAL CARE MOUTH RINSE: 15.0000 mL | Freq: Once | OROMUCOSAL | Status: AC

## 2021-12-17 MED ORDER — SUGAMMADEX SODIUM 200 MG/2ML IV SOLN
INTRAVENOUS | Status: DC | PRN
  Administered 2021-12-17: 200 mg via INTRAVENOUS

## 2021-12-17 MED ORDER — CHLORHEXIDINE GLUCONATE 0.12 % MT SOLN: 15.0000 mL | Freq: Once | OROMUCOSAL | Status: AC

## 2021-12-17 MED ORDER — OXYCODONE HCL 5 MG PO TABS: 5.0000 mg | ORAL_TABLET | Freq: Once | ORAL | Status: DC | PRN

## 2021-12-17 SURGICAL SUPPLY — 41 items
BAG DRAIN CYSTO-URO LG1000N (MISCELLANEOUS) ×4 IMPLANT
BAG DRN RND TRDRP ANRFLXCHMBR (UROLOGICAL SUPPLIES) ×2
BAG URINE DRAIN 2000ML AR STRL (UROLOGICAL SUPPLIES) ×4 IMPLANT
BRUSH SCRUB EZ 1% IODOPHOR (MISCELLANEOUS) ×4 IMPLANT
CATH FOLEY 2WAY  5CC 20FR SIL (CATHETERS)
CATH FOLEY 2WAY 5CC 20FR SIL (CATHETERS) ×3 IMPLANT
CATH URETL OPEN 5X70 (CATHETERS) ×4 IMPLANT
CNTNR SPEC 2.5X3XGRAD LEK (MISCELLANEOUS)
CONT SPEC 4OZ STER OR WHT (MISCELLANEOUS)
CONT SPEC 4OZ STRL OR WHT (MISCELLANEOUS)
CONTAINER SPEC 2.5X3XGRAD LEK (MISCELLANEOUS) IMPLANT
FIBER LASER FLEXIVA 550 (UROLOGICAL SUPPLIES) IMPLANT
FIBER LASER FLEXIVA PULSE 910 (Laser) IMPLANT
FIBER LASER MOSES 365 DFL (Laser) IMPLANT
FIBER LASER MOSES 550 DFL (Laser) ×1 IMPLANT
GAUZE 4X4 16PLY ~~LOC~~+RFID DBL (SPONGE) ×8 IMPLANT
GLOVE SURG ENC MOIS LTX SZ7 (GLOVE) ×8 IMPLANT
GLOVE SURG ENC MOIS LTX SZ7.5 (GLOVE) ×4 IMPLANT
GLOVE SURG UNDER POLY LF SZ7 (GLOVE) ×4 IMPLANT
GOWN STRL REUS W/ TWL LRG LVL3 (GOWN DISPOSABLE) ×3 IMPLANT
GOWN STRL REUS W/ TWL LRG LVL4 (GOWN DISPOSABLE) ×3 IMPLANT
GOWN STRL REUS W/ TWL XL LVL3 (GOWN DISPOSABLE) ×3 IMPLANT
GOWN STRL REUS W/TWL LRG LVL3 (GOWN DISPOSABLE) ×3
GOWN STRL REUS W/TWL LRG LVL4 (GOWN DISPOSABLE) ×3
GOWN STRL REUS W/TWL XL LVL3 (GOWN DISPOSABLE) ×3
GUIDEWIRE STR DUAL SENSOR (WIRE) ×4 IMPLANT
IV NS IRRIG 3000ML ARTHROMATIC (IV SOLUTION) ×6 IMPLANT
KIT TURNOVER CYSTO (KITS) ×4 IMPLANT
MANIFOLD NEPTUNE II (INSTRUMENTS) ×3 IMPLANT
PACK CYSTO AR (MISCELLANEOUS) ×4 IMPLANT
SET CYSTO W/LG BORE CLAMP LF (SET/KITS/TRAYS/PACK) ×4 IMPLANT
SET IRRIG Y TYPE TUR BLADDER L (SET/KITS/TRAYS/PACK) ×4 IMPLANT
SURGILUBE 2OZ TUBE FLIPTOP (MISCELLANEOUS) ×4 IMPLANT
SYR 10ML LL (SYRINGE) ×4 IMPLANT
SYR TOOMEY IRRIG 70ML (MISCELLANEOUS) ×3
SYRINGE TOOMEY IRRIG 70ML (MISCELLANEOUS) ×3 IMPLANT
TRACTIP FLEXIVA PULSE ID 200 (Laser) ×3 IMPLANT
WATER STERILE IRR 1000ML POUR (IV SOLUTION) ×4 IMPLANT
WATER STERILE IRR 3000ML UROMA (IV SOLUTION) ×4 IMPLANT
WATER STERILE IRR 500ML POUR (IV SOLUTION) ×3 IMPLANT
WIRE G XSTIFF 025X145 (WIRE) ×4 IMPLANT

## 2021-12-17 NOTE — Anesthesia Preprocedure Evaluation (Signed)
Anesthesia Evaluation  Patient identified by MRN, date of birth, ID band Patient awake    Reviewed: Allergy & Precautions, NPO status , Patient's Chart, lab work & pertinent test results  History of Anesthesia Complications Negative for: history of anesthetic complications (HR drop with dental procedure)  Airway Mallampati: II  TM Distance: >3 FB Neck ROM: Full    Dental no notable dental hx. (+) Teeth Intact   Pulmonary sleep apnea (resolved with stopping ETOH) , neg COPD, Patient abstained from smoking.Not current smoker,    Pulmonary exam normal breath sounds clear to auscultation       Cardiovascular Exercise Tolerance: Good METShypertension, Pt. on medications (-) CAD, (-) Past MI and (-) CHF (-) dysrhythmias (-) Valvular Problems/Murmurs Rhythm:Regular Rate:Normal - Systolic murmurs    Neuro/Psych neg Seizures  Neuromuscular disease negative psych ROS   GI/Hepatic GERD  ,(+)     substance abuse (stopped 12 years ago)  alcohol use,   Endo/Other  neg diabetes  Renal/GU negative Renal ROS     Musculoskeletal   Abdominal   Peds  Hematology   Anesthesia Other Findings Past Medical History: 01/15/2020: Actinic keratosis     Comment:  Nasal tip.  No date: Alcohol abuse 02/28/2019: Basal cell carcinoma     Comment:  right ant chin. Excised: 10/22/19, lateral and deep               margins involved. MOHS No date: Complication of anesthesia     Comment:  Low HR with prior sedation during dental procedure No date: Diverticulitis No date: GERD (gastroesophageal reflux disease) No date: Gout No date: History of kidney stones No date: Hyperlipidemia No date: Hypertension  Reproductive/Obstetrics                            Anesthesia Physical  Anesthesia Plan  ASA: 2  Anesthesia Plan: General   Post-op Pain Management: Ketamine IV and Ofirmev IV (intra-op)   Induction:  Intravenous  PONV Risk Score and Plan: 3 and Ondansetron, Dexamethasone and Midazolam  Airway Management Planned: Oral ETT  Additional Equipment: None  Intra-op Plan:   Post-operative Plan: Extubation in OR  Informed Consent: I have reviewed the patients History and Physical, chart, labs and discussed the procedure including the risks, benefits and alternatives for the proposed anesthesia with the patient or authorized representative who has indicated his/her understanding and acceptance.     Dental advisory given  Plan Discussed with: CRNA and Surgeon  Anesthesia Plan Comments: (Discussed risks of anesthesia with patient, including PONV, sore throat, lip/dental/eye damage. Rare risks discussed as well, such as cardiorespiratory and neurological sequelae, and allergic reactions. Discussed the role of CRNA in patient's perioperative care. Patient understands.)       Anesthesia Quick Evaluation

## 2021-12-17 NOTE — Anesthesia Procedure Notes (Signed)
Procedure Name: Intubation Date/Time: 12/17/2021 8:37 AM Performed by: Lia Foyer, CRNA Pre-anesthesia Checklist: Patient identified, Emergency Drugs available, Suction available and Patient being monitored Patient Re-evaluated:Patient Re-evaluated prior to induction Oxygen Delivery Method: Circle system utilized Preoxygenation: Pre-oxygenation with 100% oxygen Induction Type: IV induction Ventilation: Mask ventilation without difficulty Laryngoscope Size: McGraph and 4 Grade View: Grade II Tube type: Oral Tube size: 7.5 mm Number of attempts: 1 Airway Equipment and Method: Stylet and Video-laryngoscopy Placement Confirmation: ETT inserted through vocal cords under direct vision, positive ETCO2 and breath sounds checked- equal and bilateral Secured at: 21 cm Tube secured with: Tape Dental Injury: Teeth and Oropharynx as per pre-operative assessment

## 2021-12-17 NOTE — Discharge Instructions (Signed)
Bladder Stone A bladder stone is a buildup of crystals made from the proteins and minerals found in urine. These substances build up when urine becomes too concentrated. Urine is concentrated when there is less water and more proteins and minerals in it. Bladder stones usually develop when a person has another medical condition that prevents the bladder from emptying completely. Crystals can form in the small amount of urine that is left in the bladder. Bladder stones that grow large can become painful and may block the flow of urine. What are the causes? This condition may be caused by: An enlarged prostate, which prevents the bladder from emptying well. An infection of a part of your urinary system (urinary tract infection, or UTI). This includes the: Kidneys. Bladder. Ureters. These are the tubes that carry urine to your bladder. Urethra. This is the tube that drains urine from your bladder. A weak spot in the bladder that creates a small pouch (bladder diverticulum). Nerve damage that may interfere with the signals from your brain to your bladder muscles (neurogenic bladder). This can result from conditions such as Parkinson's disease or spinal cord injuries. What increases the risk? This condition is more likely to develop in people who: Get frequent UTIs. Have another medical condition that affects the bladder. Have a history of bladder surgery. Have a spinal cord injury. Have an abnormal shape of the bladder (deformity). What are the signs or symptoms? Common symptoms of this condition include: Pain in the abdomen. A need to urinate more often. Difficulty or pain when urinating. Blood in the urine. Cloudy urine or urine that is dark in color. Pain in the penis or testicles in men. Small bladder stones do not always cause symptoms. How is this diagnosed? This condition may be diagnosed based on your symptoms, medical history, and physical exam. The physical exam will check for  tenderness in your abdomen. For men, an exam in the rectum may be done to check the prostate gland. You may have tests, such as: A urine test (urinalysis). A urine sample test to check for other infections (culture). Blood tests, including tests to look for a certain substance (creatinine). A creatinine level that is higher than normal could indicate a blockage. A procedure to check your bladder using a scope with a camera (cystoscopy). You may also have imaging studies, such as: CT scan or ultrasound of your abdomen and the area between your hip bones (pelvis or pelvic area). An X-ray of your urinary system. How is this treated? This condition may be treated with: Cystolitholapaxy. This procedure uses a laser, ultrasound, or other device to break the stone into smaller pieces. Fluids are used to flush the small pieces from the area. Surgery to remove the stone. A stent. This is a small mesh tube that is threaded into your ureter to make urine flow. Medicines to treat pain. Follow these instructions at home: Medicines Take over-the-counter and prescription medicines only as told by your health care provider. Ask your health care provider if the medicine prescribed to you: Requires you to avoid driving or using heavy machinery. Can cause constipation. You may need to take these actions to prevent or treat constipation: Take over-the-counter or prescription medicines. Eat foods that are high in fiber, such as beans, whole grains, and fresh fruits and vegetables. Limit foods that are high in fat and processed sugars, such as fried or sweet foods. Alcohol use Do not drink alcohol if: Your health care provider tells you not to drink.  You are pregnant, may be pregnant, or are planning to become pregnant. If you drink alcohol: Limit how much you drink to: 0-1 drink a day for women. 0-2 drinks a day for men. Be aware of how much alcohol is in your drink. In the [place] one drink equals one 12  oz bottle of beer (355 mL), one 5 oz glass of wine (148 mL), or one 1 oz glass of hard liquor (44 mL). Activity Rest as told by your health care provider. Return to your normal activities as told by your health care provider. Ask your health care provider what activities are safe for you. General instructions Drink enough fluid to keep your urine pale yellow. Tell your health care provider about any unusual symptoms related to urinating. Early diagnosis of an enlarged prostate and other bladder conditions may reduce your risk of getting bladder stones. Do not use any products that contain nicotine or tobacco, such as cigarettes, e-cigarettes, or chewing tobacco. If you need help quitting, ask your health care provider. Do not use drugs. Where to find more information [facility]): www.urologyhealth.org Contact a health care provider if you: Have a fever. Feel nauseous or vomit. Are unable to urinate. Have a large amount of blood in your urine. Get help right away if you: Have severe back pain or pain in the lower part of your abdomen. Cannot eat or drink without vomiting. Vomit after taking your medicine. Summary A bladder stone is a buildup of crystals made from the proteins and minerals found in urine. These substances build up when urine becomes too concentrated. Bladder stones that grow large can become painful and may block the flow of urine. Bladder stones may be treated with a laser, a stent, surgery, or pain medicines. This information is not intended to replace advice given to you by your health care provider. Make sure you discuss any questions you have with your health care provider. Document Revised: 05/31/2019 Document Reviewed: 05/31/2019 Elsevier Patient Education  [place].

## 2021-12-17 NOTE — Op Note (Signed)
Preoperative diagnosis: 1.  Right ureterolithiasis (N20.1)                                           2.  Bladder calculus (N21.0)                                           3.  Left nephrolithiasis (N20.0)  Postoperative diagnosis: 1.  Bladder calculus (N21.0)                                             2.  Bladder diverticuli (N32.3)                                             3.  Probable neurogenic bladder (N31)  Procedure: 1.  Cystoscopy litholapaxy of bladder stone with holmium laser (CPT  29924)                      2.  Bilateral ureteroscopy (CPT 52353)                      3.  Fluoroscopy (CPT 76000)  Surgeon: Otelia Limes. Yves Dill MD  Anesthesia: General  Indications: 56 year old (DOB Mar 23, 1966) white male who presented to the office with intermittent right flank pain.  He also has a history of difficulty voiding and urethral stricture disease.  He had an IVP, which revealed a 7 x 8 mm left lower pole renal stone and an 8 x 16 mm bladder stone and a 6 x 9 mm distal right ureteral stone.  He had moderate postvoid residual.  He has a history of urethral stricture disease and is status post urethroplasty and/or dilatation of the stricture in 1992.  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 He does have significant lower urinary tract symptoms including incomplete emptying, frequency, intermittency, urgency, weak stream, straining and nocturia.  IPS score 14 with a quality of life score 3.  He comes in now for a cystoscopy with possible internal urethrotomy versus Optilume, right retrograde pyelography with right ureteroscopic ureterolithotomy with holmium laser and litholapaxy of bladder stone with holmium laser. See the history and physical also. After informed consent the above procedure(s) were requested.     Technique and findings: After adequate general anesthesia obtained patient was placed into dorsal lithotomy position and the perineum was prepped and draped in the  usual fashion.  The 21 French cystoscope was coupled with a camera and visually advanced into the bladder.  The prostate was not obstructing.  The bladder neck was slightly high riding but not obstructive.  The bladder was thoroughly inspected.  The bladder was moderately trabeculated with several small diverticula present.  The right ureteral orifice was normally situated on the trigone and the left orifice was situated lateral to the trigone.  A 30 mm bladder stone was identified.  The bladder stone was then disintegrated with the 550 m holmium Moses laser fiber set at the dust setting.  The stone fragments  were then evacuated from the bladder with a Toomey syringe.  A 0.035 Glidewire was advanced up the right ureter under fluoroscopic guidance.  The short semirigid mini ureteroscope was then visually advanced into the right ureter.  The ureteroscope was passed all the way up to the right UPJ.  No tumors or stones were identified.  At this point the guidewire was removed from the right side and passed up the left ureter.  The short semirigid mini ureteroscope was then visually advanced into the left ureter and all the way up to the UPJ.  No tumors or stones were identified.  At this point the ureteroscope and guidewire were removed.  10 cc of viscous Xylocaine was instilled within the urethra and the bladder.  The procedure was then terminated and patient transferred to the recovery room in stable condition.

## 2021-12-17 NOTE — Transfer of Care (Signed)
Immediate Anesthesia Transfer of Care Note  Patient: [place]  Procedure(s) Performed: CYSTOSCOPY WITH DIRECT VISION INTERNAL URETHROTOMY OPTILUME URETEROSCOPY WITH HOLMIUM LASER LITHOTRIPSY (Right) CYSTOSCOPY WITH LITHOLAPAXY WITH HOLMIUM LASER  Patient Location: PACU  Anesthesia Type:General  Level of Consciousness: drowsy  Airway & Oxygen Therapy: Patient Spontanous Breathing and Patient connected to face mask oxygen  Post-op Assessment: Report given to RN and Post -op Vital signs reviewed and stable  Post vital signs: Reviewed and stable  Last Vitals:  Vitals Value Taken Time  BP 129/66 12/17/21 0946  Temp    Pulse 67 12/17/21 0949  Resp 17 12/17/21 0949  SpO2 99 % 12/17/21 0949  Vitals shown include unvalidated device data.  Last Pain:  Vitals:   12/17/21 0755  TempSrc: Temporal  PainSc: 0-No pain         Complications: No notable events documented.

## 2021-12-17 NOTE — Anesthesia Postprocedure Evaluation (Signed)
Anesthesia Post Note  Patient: Douglas Vega  Procedure(s) Performed: CYSTOSCOPY WITH DIRECT VISION INTERNAL URETHROTOMY OPTILUME URETEROSCOPY WITH HOLMIUM LASER LITHOTRIPSY (Right) CYSTOSCOPY WITH LITHOLAPAXY WITH HOLMIUM LASER  Patient location during evaluation: PACU Anesthesia Type: General Level of consciousness: awake and alert Pain management: pain level controlled Vital Signs Assessment: post-procedure vital signs reviewed and stable Respiratory status: spontaneous breathing, nonlabored ventilation, respiratory function stable and patient connected to nasal cannula oxygen Cardiovascular status: blood pressure returned to baseline and stable Postop Assessment: no apparent nausea or vomiting Anesthetic complications: no   No notable events documented.   Last Vitals:  Vitals:   12/17/21 1040 12/17/21 1043  BP:  139/86  Pulse: 66 61  Resp: 10 14  Temp:  (!) 36.2 C  SpO2: 95% 95%    Last Pain:  Vitals:   12/17/21 1043  TempSrc: Temporal  PainSc: 0-No pain                 Arita Miss

## 2021-12-17 NOTE — H&P (Signed)
Date of Initial H&P: 12/11/21  History reviewed, patient examined, no change in status, stable for surgery.

## 2021-12-24 NOTE — Progress Notes (Signed)
3

## 2022-02-03 ENCOUNTER — Other Ambulatory Visit: Payer: Self-pay

## 2022-02-03 ENCOUNTER — Ambulatory Visit: Payer: BC Managed Care – PPO | Admitting: Dermatology

## 2022-02-03 DIAGNOSIS — L719 Rosacea, unspecified: Secondary | ICD-10-CM | POA: Diagnosis not present

## 2022-02-03 DIAGNOSIS — L578 Other skin changes due to chronic exposure to nonionizing radiation: Secondary | ICD-10-CM | POA: Diagnosis not present

## 2022-02-03 DIAGNOSIS — L280 Lichen simplex chronicus: Secondary | ICD-10-CM | POA: Diagnosis not present

## 2022-02-03 DIAGNOSIS — L570 Actinic keratosis: Secondary | ICD-10-CM | POA: Diagnosis not present

## 2022-02-03 MED ORDER — CALCIPOTRIENE 0.005 % EX CREA: TOPICAL_CREAM | CUTANEOUS | 1 refills | Status: DC

## 2022-02-03 NOTE — Patient Instructions (Addendum)

## 2022-02-03 NOTE — Progress Notes (Signed)
? ?Follow-Up Visit ?  ?Subjective  ?Douglas Vega is a 56 y.o. male who presents for the following: Follow-up (Recheck his face/nose, pt using Mometasone cream and Opzelura cream with a fair response, pt report he feel some rough places on his nose that may need to be treated today). ?The patient has spots, moles and lesions to be evaluated, some may be new or changing and the patient has concerns that these could be cancer. ? ?The following portions of the chart were reviewed this encounter and updated as appropriate:  ? Tobacco  Allergies  Meds  Problems  Med Hx  Surg Hx  Fam Hx   ?  ?Review of Systems:  No other skin or systemic complaints except as noted in HPI or Assessment and Plan. ? ?Objective  ?Well appearing patient in no apparent distress; mood and affect are within normal limits. ? ?A focused examination was performed including scalp, head, eyes, ears, nose, lips, neck.   All findings within normal limits unless otherwise noted below. ? ?Nose ?No scab ? ?left nasal tip, anterior nasal rim  (5) (5) ?Erythematous thin papules/macules with gritty scale.  ? ?face ?Mid face erythema with telangiectasias +/- scattered inflammatory papules.  ? ? ?Assessment & Plan  ? ?Lichen simplex chronicus ?Secondary to self described picking on the areas. ?Nose ?Much improved with treatment.  ?Most consistent with LSC.  ? ?Start Calcipotriene cream apply to nose a bedtime   ?Continue opzelura qd.  ?Continue mometasone BID 5 days/week.  ?  ?Cover when able. Avoid touching and picking.  Pt admits he does this, but has done better which has resulted in improvement and was even better until he found himself going back to the habit of picking again. ? ?Related Medications ?calcipotriene (DOVONOX) 0.005 % cream ?Apply to nose at bedtime ? ?AK (actinic keratosis) (5)  ?with some associated Lichen Simplex Chronicus ?left nasal tip, anterior nasal rim  (5) ? ?Actinic keratoses are precancerous spots that appear secondary  to cumulative UV radiation exposure/sun exposure over time. They are chronic with expected duration over 1 year. A portion of actinic keratoses will progress to squamous cell carcinoma of the skin. It is not possible to reliably predict which spots will progress to skin cancer and so treatment is recommended to prevent development of skin cancer. ? ?Recommend daily broad spectrum sunscreen SPF 30+ to sun-exposed areas, reapply every 2 hours as needed.  ?Recommend staying in the shade or wearing long sleeves, sun glasses (UVA+UVB protection) and wide brim hats (4-inch brim around the entire circumference of the hat). ?Call for new or changing lesions.  ? ?May consider field treatment in the future  ? ?Destruction of lesion - left nasal tip, anterior nasal rim  (5) ?Complexity: simple   ?Destruction method: cryotherapy   ?Informed consent: discussed and consent obtained   ?Timeout:  patient name, date of birth, surgical site, and procedure verified ?Lesion destroyed using liquid nitrogen: Yes   ?Region frozen until ice ball extended beyond lesion: Yes   ?Outcome: patient tolerated procedure well with no complications   ?Post-procedure details: wound care instructions given   ? ?Rosacea ?face ?Rosacea is a chronic progressive skin condition usually affecting the face of adults, causing redness and/or acne bumps. It is treatable but not curable. It sometimes affects the eyes (ocular rosacea) as well. It may respond to topical and/or systemic medication and can flare with stress, sun exposure, alcohol, exercise and some foods.  Daily application of broad spectrum spf  30+ sunscreen to face is recommended to reduce flares.  ?Consider treatment in future. ? ?Actinic Damage - Severe, confluent actinic changes with pre-cancerous actinic keratoses  ?- Severe, chronic, not at goal, secondary to cumulative UV radiation exposure over time ?- diffuse scaly erythematous macules and papules with underlying dyspigmentation ?-  Discussed Prescription "Field Treatment" for Severe, Chronic Confluent Actinic Changes with Pre-Cancerous Actinic Keratoses ? ?May consider field treatment in the future  ? ?Field treatment involves treatment of an entire area of skin that has confluent Actinic Changes (Sun/ Ultraviolet light damage) and PreCancerous Actinic Keratoses by method of PhotoDynamic Therapy (PDT) and/or prescription Topical Chemotherapy agents such as 5-fluorouracil, 5-fluorouracil/calcipotriene, and/or imiquimod.  The purpose is to decrease the number of clinically evident and subclinical PreCancerous lesions to prevent progression to development of skin cancer by chemically destroying early precancer changes that may or may not be visible.  It has been shown to reduce the risk of developing skin cancer in the treated area. As a result of treatment, redness, scaling, crusting, and open sores may occur during treatment course. One or more than one of these methods may be used and may have to be used several times to control, suppress and eliminate the PreCancerous changes. Discussed treatment course, expected reaction, and possible side effects. ?- Recommend daily broad spectrum sunscreen SPF 30+ to sun-exposed areas, reapply every 2 hours as needed.  ?- Staying in the shade or wearing long sleeves, sun glasses (UVA+UVB protection) and wide brim hats (4-inch brim around the entire circumference of the hat) are also recommended. ?- Call for new or changing lesions.  ? ?Return in about 2 months (around 04/05/2022) for TBSE, Aks . ? ?I, Marye Round, CMA, am acting as scribe for Sarina Ser, MD .  ?Documentation: I have reviewed the above documentation for accuracy and completeness, and I agree with the above. ? ?Sarina Ser, MD ? ?

## 2022-02-04 ENCOUNTER — Telehealth: Payer: Self-pay

## 2022-02-04 NOTE — Telephone Encounter (Signed)
His insurance prefers Calcipotriene ointment, is this appropriate for his treatment?  ?

## 2022-02-08 ENCOUNTER — Encounter: Payer: Self-pay | Admitting: Dermatology

## 2022-02-09 ENCOUNTER — Telehealth: Payer: Self-pay

## 2022-02-09 MED ORDER — CALCIPOTRIENE 0.005 % EX OINT: TOPICAL_OINTMENT | CUTANEOUS | 1 refills | Status: AC

## 2022-02-09 NOTE — Addendum Note (Signed)
Addended by: Johnsie Kindred R on: 02/09/2022 07:50 AM ? ? Modules accepted: Orders ? ?

## 2022-02-09 NOTE — Telephone Encounter (Signed)
Will call and advise patient of medication change. AW to send in Rx. ?

## 2022-04-05 ENCOUNTER — Ambulatory Visit: Payer: BC Managed Care – PPO | Admitting: Dermatology

## 2022-04-05 ENCOUNTER — Encounter: Payer: Self-pay | Admitting: Dermatology

## 2022-04-05 DIAGNOSIS — L821 Other seborrheic keratosis: Secondary | ICD-10-CM

## 2022-04-05 DIAGNOSIS — L280 Lichen simplex chronicus: Secondary | ICD-10-CM | POA: Diagnosis not present

## 2022-04-05 DIAGNOSIS — L570 Actinic keratosis: Secondary | ICD-10-CM

## 2022-04-05 DIAGNOSIS — Z1283 Encounter for screening for malignant neoplasm of skin: Secondary | ICD-10-CM

## 2022-04-05 DIAGNOSIS — D1800 Hemangioma unspecified site: Secondary | ICD-10-CM

## 2022-04-05 DIAGNOSIS — D229 Melanocytic nevi, unspecified: Secondary | ICD-10-CM

## 2022-04-05 DIAGNOSIS — Z85828 Personal history of other malignant neoplasm of skin: Secondary | ICD-10-CM

## 2022-04-05 DIAGNOSIS — L578 Other skin changes due to chronic exposure to nonionizing radiation: Secondary | ICD-10-CM | POA: Diagnosis not present

## 2022-04-05 DIAGNOSIS — L814 Other melanin hyperpigmentation: Secondary | ICD-10-CM

## 2022-04-05 MED ORDER — FLUOROURACIL 5 % EX CREA: TOPICAL_CREAM | CUTANEOUS | 1 refills | Status: DC

## 2022-04-05 NOTE — Progress Notes (Signed)
Follow-Up Visit   Subjective  Douglas Vega is a 56 y.o. male who presents for the following: Annual Exam (Skin cancer screening. Full body. Hx BCC's, Hx AK's) and Actinic Keratosis (2 month recheck. Tx with LN2 at last visit. Using  Calcipotriene cream to nose a bedtime, opzelura qd. and mometasone BID 5 days/week. Sometimes does not use Mometasone).  Patient does admit to rubbing picking at the spot on his nose that tends to get thick and scaly and rough.  This scaly roughness them tends to make him want to pick more.  He knows it is a habit and he tries to be conscientious about it. The patient presents for Total-Body Skin Exam (TBSE) for skin cancer screening and mole check.  The patient has spots, moles and lesions to be evaluated, some may be new or changing and the patient has concerns that these could be cancer.  The following portions of the chart were reviewed this encounter and updated as appropriate:  Tobacco  Allergies  Meds  Problems  Med Hx  Surg Hx  Fam Hx     Review of Systems: No other skin or systemic complaints except as noted in HPI or Assessment and Plan.  Objective  Well appearing patient in no apparent distress; mood and affect are within normal limits.  A full examination was performed including scalp, head, eyes, ears, nose, lips, neck, chest, axillae, abdomen, back, buttocks, bilateral upper extremities, bilateral lower extremities, hands, feet, fingers, toes, fingernails, and toenails. All findings within normal limits unless otherwise noted below.  Nose, face x5 (5) Erythematous thin papules/macules with gritty scale. Left nasal tip with ~0.6cm area of roughness.       Assessment & Plan   History of Basal Cell Carcinoma of the Skin. Right chin. Mohs 10/22/2019. - No evidence of recurrence today - Recommend regular full body skin exams - Recommend daily broad spectrum sunscreen SPF 30+ to sun-exposed areas, reapply every 2 hours as needed.  - Call  if any new or changing lesions are noted between office visits   Lentigines - Scattered tan macules - Due to sun exposure - Benign-appearing, observe - Recommend daily broad spectrum sunscreen SPF 30+ to sun-exposed areas, reapply every 2 hours as needed. - Call for any changes  Seborrheic Keratoses - Stuck-on, waxy, tan-brown papules and/or plaques  - Benign-appearing - Discussed benign etiology and prognosis. - Observe - Call for any changes  Melanocytic Nevi - Tan-brown and/or pink-flesh-colored symmetric macules and papules - Benign appearing on exam today - Observation - Call clinic for new or changing moles - Recommend daily use of broad spectrum spf 30+ sunscreen to sun-exposed areas.   Hemangiomas - Red papules - Discussed benign nature - Observe - Call for any changes  Actinic Damage - Severe, confluent actinic changes with pre-cancerous actinic keratoses  - Severe, chronic, not at goal, secondary to cumulative UV radiation exposure over time - diffuse scaly erythematous macules and papules with underlying dyspigmentation - Discussed Prescription "Field Treatment" for Severe, Chronic Confluent Actinic Changes with Pre-Cancerous Actinic Keratoses Field treatment involves treatment of an entire area of skin that has confluent Actinic Changes (Sun/ Ultraviolet light damage) and PreCancerous Actinic Keratoses by method of PhotoDynamic Therapy (PDT) and/or prescription Topical Chemotherapy agents such as 5-fluorouracil, 5-fluorouracil/calcipotriene, and/or imiquimod.  The purpose is to decrease the number of clinically evident and subclinical PreCancerous lesions to prevent progression to development of skin cancer by chemically destroying early precancer changes that may or may not  be visible.  It has been shown to reduce the risk of developing skin cancer in the treated area. As a result of treatment, redness, scaling, crusting, and open sores may occur during treatment course.  One or more than one of these methods may be used and may have to be used several times to control, suppress and eliminate the PreCancerous changes. Discussed treatment course, expected reaction, and possible side effects. - Recommend daily broad spectrum sunscreen SPF 30+ to sun-exposed areas, reapply every 2 hours as needed.  - Staying in the shade or wearing long sleeves, sun glasses (UVA+UVB protection) and wide brim hats (4-inch brim around the entire circumference of the hat) are also recommended. - Call for new or changing lesions.   Start Fluorouracil 5% with Calcipotriene cream M, W, F at bedtime Mometasone Tue, Thur, Sat at bedtime. Continue Opzelura every morning  Patient does admit to rubbing picking at the spot on his nose that tends to get thick and scaly and rough.  This scaly roughness them tends to make him want to pick more.  He knows it is a habit and he tries to be conscientious about it.  Skin cancer screening performed today.  AK (actinic keratosis) (6) face x5 (5); Nose  Hx of Bx proven AK with LSC. Hx of previous 5FU treatment.   Actinic keratoses are precancerous spots that appear secondary to cumulative UV radiation exposure/sun exposure over time. They are chronic with expected duration over 1 year. A portion of actinic keratoses will progress to squamous cell carcinoma of the skin. It is not possible to reliably predict which spots will progress to skin cancer and so treatment is recommended to prevent development of skin cancer. Recommend daily broad spectrum sunscreen SPF 30+ to sun-exposed areas, reapply every 2 hours as needed.  Recommend staying in the shade or wearing long sleeves, sun glasses (UVA+UVB protection) and wide brim hats (4-inch brim around the entire circumference of the hat). Call for new or changing lesions.  Start Fluorouracil 5% with Calcipotriene cream M, W, F to nose at bedtime Mometasone Tue, Thur, Sat at bedtime. Continue Opzelura every  morning   Destruction of lesion - face x5 Complexity: simple   Destruction method: cryotherapy   Informed consent: discussed and consent obtained   Timeout:  patient name, date of birth, surgical site, and procedure verified Lesion destroyed using liquid nitrogen: Yes   Region frozen until ice ball extended beyond lesion: Yes   Outcome: patient tolerated procedure well with no complications   Post-procedure details: wound care instructions given    fluorouracil (EFUDEX) 5 % cream - Nose Apply to nose at bedtime Monday, Wednesday, Friday  Return in about 2 months (around 06/05/2022) for AK Follow Up.  I, Emelia Salisbury, CMA, am acting as scribe for Sarina Ser, MD. Documentation: I have reviewed the above documentation for accuracy and completeness, and I agree with the above.  Sarina Ser, MD

## 2022-04-05 NOTE — Patient Instructions (Addendum)
Cryotherapy Aftercare ? ?Wash gently with soap and water everyday.   ?Apply Vaseline daily until healed.  ? ?Prior to procedure, discussed risks of blister formation, small wound, skin dyspigmentation, or rare scar following cryotherapy. Recommend Vaseline ointment to treated areas while healing.  ? ? ?Nose ?Start Fluorouracil 5% with Calcipotriene cream M, W, F to nose at bedtime until follow up appointment.  ? ?Mometasone Tue, Thur, Sat at bedtime. ? ?Continue Opzelura every morning  ? ?Avoid picking areas on nose ? ?Start 5-fluorouracil/calcipotriene cream  Prescription sent to [facility]. Patient provided with contact information for pharmacy and advised the pharmacy will mail the prescription to their home. Patient provided with handout reviewing treatment course and side effects and advised to call or message [place] on MyChart with any concerns.  ? ?5-Fluorouracil/Calcipotriene Patient Education  ? ?Actinic keratoses are the dry, red scaly spots on the skin caused by sun damage. A portion of these spots can turn into skin cancer with time, and treating them can help prevent development of skin cancer.  ? ?Treatment of these spots requires removal of the defective skin cells. There are various ways to remove actinic keratoses, including freezing with liquid nitrogen, treatment with creams, or treatment with a blue light procedure in the office.  ? ?5-fluorouracil cream is a topical cream used to treat actinic keratoses. It works by interfering with the growth of abnormal fast-growing skin cells, such as actinic keratoses. These cells peel off and are replaced by healthy ones.  ? ?5-fluorouracil/calcipotriene is a combination of the 5-fluorouracil cream with a vitamin D analog cream called calcipotriene. The calcipotriene alone does not treat actinic keratoses. However, when it is combined with 5-fluorouracil, it helps the 5-fluorouracil treat the actinic keratoses much faster so that the same results can be  achieved with a much shorter treatment time. ? ?INSTRUCTIONS FOR 5-FLUOROURACIL/CALCIPOTRIENE CREAM:  ? ?5-fluorouracil/calcipotriene cream typically only needs to be used for 4-7 days. A thin layer should be applied twice a day to the treatment areas recommended by your physician.  ? ?If your physician prescribed you separate tubes of 5-fluourouracil and calcipotriene, apply a thin layer of 5-fluorouracil followed by a thin layer of calcipotriene.  ? ?Avoid contact with your eyes, nostrils, and mouth. Do not use 5-fluorouracil/calcipotriene cream on infected or open wounds.  ? ?You will develop redness, irritation and some crusting at areas where you have pre-cancer damage/actinic keratoses. IF YOU DEVELOP PAIN, BLEEDING, OR SIGNIFICANT CRUSTING, STOP THE TREATMENT EARLY - you have already gotten a good response and the actinic keratoses should clear up well. ? ?Wash your hands after applying 5-fluorouracil 5% cream on your skin.  ? ?A moisturizer or sunscreen with a minimum SPF 30 should be applied each morning.  ? ?Once you have finished the treatment, you can apply a thin layer of Vaseline twice a day to irritated areas to soothe and calm the areas more quickly. If you experience significant discomfort, contact your physician. ? ?For some patients it is necessary to repeat the treatment for best results. ? ?SIDE EFFECTS: When using 5-fluorouracil/calcipotriene cream, you may have mild irritation, such as redness, dryness, swelling, or a mild burning sensation. This usually resolves within 2 weeks. The more actinic keratoses you have, the more redness and inflammation you can expect during treatment. Eye irritation has been reported rarely. If this occurs, please let us know.  ?If you have any trouble using this cream, please call the office. If you have any other questions about this  information, please do not hesitate to ask me before you leave the office. ? ? ? ?Melanoma ABCDEs ? ? ?Melanoma is the most  dangerous type of skin cancer, and is the leading cause of death from skin disease.  You are more likely to develop melanoma if you: ?Have light-colored skin, light-colored eyes, or red or blond hair ?Spend a lot of time in the sun ?Tan regularly, either outdoors or in a tanning bed ?Have had blistering sunburns, especially during childhood ?Have a close family member who has had a melanoma ?Have atypical moles or large birthmarks ? ?Early detection of melanoma is key since treatment is typically straightforward and cure rates are extremely high if we catch it early.  ? ?The first sign of melanoma is often a change in a mole or a new dark spot.  The ABCDE system is a way of remembering the signs of melanoma. ? ?A for asymmetry:  The two halves do not match. ?B for border:  The edges of the growth are irregular. ?C for color:  A mixture of colors are present instead of an even brown color. ?D for diameter:  Melanomas are usually (but not always) greater than 83m - the size of a pencil eraser. ?E for evolution:  The spot keeps changing in size, shape, and color. ? ?Please check your skin once per month between visits. You can use a small mirror in front and a large mirror behind you to keep an eye on the back side or your body.  ? ?If you see any new or changing lesions before your next follow-up, please call to schedule a visit. ? ?Please continue daily skin protection including broad spectrum sunscreen SPF 30+ to sun-exposed areas, reapplying every 2 hours as needed when you're outdoors.  ? ?Staying in the shade or wearing long sleeves, sun glasses (UVA+UVB protection) and wide brim hats (4-inch brim around the entire circumference of the hat) are also recommended for sun protection.   ? ? ?If You Need Anything After Your Visit ? ?If you have any questions or concerns for your doctor, please call our main line at [phone] press option 4 to reach your doctor's medical assistant. If no one answers, please  leave a voicemail as directed and we will return your call as soon as possible. Messages left after 4 pm will be answered the following business day.  ? ?You may also send [place] message via MyChart. We typically respond to MyChart messages within 1-2 business days. ? ?For prescription refills, please ask your pharmacy to contact our office. Our fax number is [phone] ? ?If you have an urgent issue when the clinic is closed that cannot wait until the next business day, you can page your doctor at the number below.   ? ?Please note that while we do our best to be available for urgent issues outside of office hours, we are not available 24/7.  ? ?If you have an urgent issue and are unable to reach [place] you may choose to seek medical care at your doctor's office, retail clinic, urgent care center, or emergency room. ? ?If you have a medical emergency, please immediately call 911 or go to the emergency department. ? ?Pager Numbers ? ?- Dr. KNehemiah Massed [phone]? ?- Dr. MLaurence Ferrari [phone]? ?- Dr. SNicole Kindred [phone]? ?In the event of inclement weather, please call our main line at [phone] an update on the status of any delays or closures. ? ?Dermatology Medication Tips: ?  Please keep the boxes that topical medications come in in order to help keep track of the instructions about where and how to use these. Pharmacies typically print the medication instructions only on the boxes and not directly on the medication tubes.  ? ?If your medication is too expensive, please contact our office at [phone] option 4 or send [place] a message through [place].  ? ?We are unable to tell what your co-pay for medications will be in advance as this is different depending on your insurance coverage. However, we may be able to find a substitute medication at lower cost or fill out paperwork to get insurance to cover a needed medication.  ? ?If a prior authorization is required to get your medication covered by your insurance  company, please allow [place] 1-2 business days to complete this process. ? ?Drug prices often vary depending on where the prescription is filled and some pharmacies may offer cheaper prices. ? ?The website [email or web address]

## 2022-04-16 ENCOUNTER — Encounter: Payer: Self-pay | Admitting: Dermatology

## 2022-05-31 ENCOUNTER — Ambulatory Visit: Payer: BC Managed Care – PPO | Admitting: Dermatology

## 2022-05-31 DIAGNOSIS — L280 Lichen simplex chronicus: Secondary | ICD-10-CM

## 2022-05-31 DIAGNOSIS — L578 Other skin changes due to chronic exposure to nonionizing radiation: Secondary | ICD-10-CM

## 2022-05-31 DIAGNOSIS — L719 Rosacea, unspecified: Secondary | ICD-10-CM | POA: Diagnosis not present

## 2022-05-31 DIAGNOSIS — Z85828 Personal history of other malignant neoplasm of skin: Secondary | ICD-10-CM

## 2022-05-31 MED ORDER — STRATA GRT EX GEL: CUTANEOUS | 2 refills | Status: DC

## 2022-05-31 MED ORDER — DOXYCYCLINE HYCLATE 20 MG PO TABS: ORAL_TABLET | ORAL | 2 refills | Status: DC

## 2022-05-31 MED ORDER — OPZELURA 1.5 % EX CREA: TOPICAL_CREAM | CUTANEOUS | 2 refills | Status: AC

## 2022-05-31 NOTE — Progress Notes (Unsigned)
   Follow-Up Visit   Subjective  Douglas Vega is a 56 y.o. male who presents for the following: Follow-up (2 month recheck. Using  Calcipotriene cream to nose a bedtime, opzelura qd. and mometasone BID 5 days/week. Sometimes does not use Mometasone).  Patient does admit to rubbing picking and shaving at the spot on his nose that tends to get thick and scaly and rough.  This scaly roughness them tends to make him want to pick more.  He knows it is a habit and he tries to be conscientious about it.).    The following portions of the chart were reviewed this encounter and updated as appropriate:       Review of Systems:  No other skin or systemic complaints except as noted in HPI or Assessment and Plan.  Objective  Well appearing patient in no apparent distress; mood and affect are within normal limits.  A focused examination was performed including face,nose. Relevant physical exam findings are noted in the Assessment and Plan.  Nose Erythematous thin papules/macules with gritty scale.      Head - Anterior (Face) Mid face erythema with telangiectasias +/- scattered inflammatory papules.     Assessment & Plan  Lichen simplex chronicus Nose  Hx of Bx proven AK with LSC. Hx of previous 5FU treatment.    Start Strataderm grt apply to affected skin on the nose twice a day Recommend N-acetylcysteine (NAC) 600 mg supplement three times per day to help with picking  Continue Opezlura cream apply to affected skin once a day  May consider adding Wellbutrin in the future   Dr Nicole Kindred examined patient today   Cover when able. Avoid touching and picking.  May consider Wellbutrin in the future to help control the habit of picking.    Related Medications Wound Dressings (STRATA GRT) GEL Apply to affected area on the nose twice a day  Rosacea Head - Anterior (Face)  Rosacea is a chronic progressive skin condition usually affecting the face of adults, causing redness and/or acne  bumps. It is treatable but not curable. It sometimes affects the eyes (ocular rosacea) as well. It may respond to topical and/or systemic medication and can flare with stress, sun exposure, alcohol, exercise and some foods.  Daily application of broad spectrum spf 30+ sunscreen to face is recommended to reduce flares.   Start Doxycycline 20 mg take 1 tablet twice a day with food #60 2RF  Start skin medicinals Rosacea triple cream apply to face once a day   Doxycycline should be taken with food to prevent nausea. Do not lay down for 30 minutes after taking. Be cautious with sun exposure and use good sun protection while on this medication. Pregnant women should not take this medication.    Related Medications doxycycline (PERIOSTAT) 20 MG tablet Take 2 tablets  once a day with food and water   Return in about 4 weeks (around 06/28/2022) for [facility], Hx of AK, Rosacea .  IMarye Round, CMA, am acting as scribe for Sarina Ser, MD .

## 2022-05-31 NOTE — Patient Instructions (Addendum)
Doxycycline should be taken with food to prevent nausea. Do not lay down for 30 minutes after taking. Be cautious with sun exposure and use good sun protection while on this medication. Pregnant women should not take this medication.      Instructions for [facility] Medications  One or more of your medications was sent to the [facility] mail order compounding pharmacy. You will receive an email from them and can purchase the medicine through that link. It will then be mailed to your home at the address you confirmed. If for any reason you do not receive an email from them, please check your spam folder. If you still do not find the email, please let us know. [facility] phone number is [phone].      Due to recent changes in healthcare laws, you may see results of your pathology and/or laboratory studies on MyChart before the doctors have had a chance to review them. We understand that in some cases there may be results that are confusing or concerning to you. Please understand that not all results are received at the same time and often the doctors may need to interpret multiple results in order to provide you with the best plan of care or course of treatment. Therefore, we ask that you please give [place] 2 business days to thoroughly review all your results before contacting the office for clarification. Should we see a critical lab result, you will be contacted sooner.   If You Need Anything After Your Visit  If you have any questions or concerns for your doctor, please call our main line at [phone] and press option 4 to reach your doctor's medical assistant. If no one answers, please leave a voicemail as directed and we will return your call as soon as possible. Messages left after 4 pm will be answered the following business day.   You may also send [place] a message via [place]. We typically respond to MyChart messages within 1-2 business days.  For prescription refills,  please ask your pharmacy to contact our office. Our fax number is [phone].  If you have an urgent issue when the clinic is closed that cannot wait until the next business day, you can page your doctor at the number below.    Please note that while we do our best to be available for urgent issues outside of office hours, we are not available 24/7.   If you have an urgent issue and are unable to reach [place], you may choose to seek medical care at your doctor's office, retail clinic, urgent care center, or emergency room.  If you have a medical emergency, please immediately call 911 or go to the emergency department.  Pager Numbers  - Dr. Nehemiah Massed: [phone]  - Dr. Laurence Ferrari: [phone]  - Dr. Nicole Kindred: [phone]  In the event of inclement weather, please call our main line at [phone] for an update on the status of any delays or closures.  Dermatology Medication Tips: Please keep the boxes that topical medications come in in order to help keep track of the instructions about where and how to use these. Pharmacies typically print the medication instructions only on the boxes and not directly on the medication tubes.   If your medication is too expensive, please contact our office at [phone] option 4 or send [place] a message through [place].   We are unable to tell what your co-pay for medications will be in advance as this is different depending on your  insurance coverage. However, we may be able to find a substitute medication at lower cost or fill out paperwork to get insurance to cover a needed medication.   If a prior authorization is required to get your medication covered by your insurance company, please allow [place] 1-2 business days to complete this process.  Drug prices often vary depending on where the prescription is filled and some pharmacies may offer cheaper prices.  The website www.goodrx.com contains coupons for medications through different pharmacies. The prices  here do not account for what the cost may be with help from insurance (it may be cheaper with your insurance), but the website can give you the price if you did not use any insurance.  - You can print the associated coupon and take it with your prescription to the pharmacy.  - You may also stop by our office during regular business hours and pick up a GoodRx coupon card.  - If you need your prescription sent electronically to a different pharmacy, notify our office through [facility] or by phone at [phone] option 4.     Si Usted Necesita Algo Despus de Su Visita  Tambin puede enviarnos un mensaje a travs de Pharmacist, community. Por lo general respondemos a los mensajes de MyChart en el transcurso de 1 a 2 das hbiles.  Para renovar recetas, por favor pida a su farmacia que se ponga en contacto con nuestra oficina. Harland Dingwall de fax es [place] [phone].  Si tiene un asunto urgente cuando la clnica est cerrada y que no puede esperar hasta el siguiente da hbil, puede llamar/localizar a su doctor(a) al nmero que aparece a continuacin.   Por favor, tenga en cuenta que aunque hacemos todo lo posible para estar disponibles para asuntos urgentes fuera del horario de [place], no estamos disponibles las 24 horas del da, los 7 das de la [place].   Si tiene un problema urgente y no puede comunicarse con nosotros, puede optar por buscar atencin mdica  en el consultorio de su doctor(a), en una clnica privada, en un centro de atencin urgente o en una sala de emergencias.  Si tiene Engineering geologist, por favor llame inmediatamente al 911 o vaya a la sala de emergencias.  Nmeros de bper  - Dr. Nehemiah Massed: [phone]  - Dra. Moye: [phone]  - Dra. Nicole Kindred: [phone]  En caso de inclemencias del [place], por favor llame a Johnsie Kindred principal al [phone] para una actualizacin sobre el [place] de cualquier retraso o cierre.  Consejos para la medicacin en  dermatologa: Por favor, guarde las cajas en las que vienen los medicamentos de uso tpico para ayudarle a seguir las instrucciones sobre dnde y cmo usarlos. Las farmacias generalmente imprimen las instrucciones del medicamento slo en las cajas y no directamente en los tubos del [place].   Si su medicamento es muy caro, por favor, pngase en contacto con Zigmund Daniel llamando al [phone] y presione la opcin 4 o envenos un mensaje a travs de Pharmacist, community.   No podemos decirle cul ser su copago por los medicamentos por adelantado ya que esto es diferente dependiendo de la cobertura de su seguro. Sin embargo, es posible que podamos encontrar un medicamento sustituto a Electrical engineer un formulario para que el seguro cubra el medicamento que se considera necesario.   Si se requiere una autorizacin previa para que su compaa de seguros [place] su medicamento, por favor permtanos de 1 a 2 das hbiles para completar este proceso.  Los  precios de los medicamentos varan con frecuencia dependiendo del lugar de dnde se surte la receta y alguna farmacias pueden ofrecer precios ms baratos.  El sitio web www.goodrx.com tiene cupones para medicamentos de Airline pilot. Los precios aqu no tienen en cuenta lo que podra costar con la ayuda del seguro (puede ser ms barato con su seguro), pero el sitio web puede darle el precio si no utiliz Research scientist (physical sciences).  - Puede imprimir el cupn correspondiente y llevarlo con su receta a la farmacia.  - Tambin puede pasar por nuestra oficina durante el horario de atencin regular y Charity fundraiser una tarjeta de cupones de GoodRx.  - Si necesita que su receta se enve electrnicamente a una farmacia diferente, informe a nuestra oficina a travs de MyChart de [place] o por telfono llamando al [phone] y presione la opcin 4.

## 2022-06-01 ENCOUNTER — Encounter: Payer: Self-pay | Admitting: Dermatology

## 2022-06-01 NOTE — Progress Notes (Signed)
Follow-Up Visit   Subjective  Douglas Vega is a 56 y.o. male who presents for the following: Follow-up 2 month recheck. Using Calcipotriene cream to nose at bedtime, opzelura qd. and mometasone BID 5 days/week. Sometimes does not use Mometasone).   Patient does admit to rubbing picking and shaving with a razor at the spots on his nose that tends to get thick and scaly and rough and scabbed. This scaly roughness tends to make him want to pick more.  He knows it is a habit and he tries to be conscientious about it. He states he has had a habit of picking on his face causing scabs since a teenager.  He tends to do it subconsciously when he is deep in thought, but does know he is doing it.  He is willing to get psychiatric help for this habit if needed.  The following portions of the chart were reviewed this encounter and updated as appropriate:   Tobacco  Allergies  Meds  Problems  Med Hx  Surg Hx  Fam Hx     Review of Systems:  No other skin or systemic complaints except as noted in HPI or Assessment and Plan.  Objective  Well appearing patient in no apparent distress; mood and affect are within normal limits.  A focused examination was performed including face,nose. Relevant physical exam findings are noted in the Assessment and Plan.  Nose 2 crusts underside nasal tip (see photo) with erythema.     Head - Anterior (Face) Mid face  and nose with erythema with telangiectasias +/- scattered inflammatory papules.   Assessment & Plan  Lichen simplex chronicus - persistent - likely due to chronic and frequent factitial manipulation (picking; rubbing; shaving with razor) admitted to by pt. 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 Hx of previous treatment with LN2 and 5FU treatment multiple times.    Dr Laurence Ferrari has seen pt with me within the past year and evaluated with dermoscopy with "benign" appearance. Dr  Nicole Kindred evaluated the patient today with me.  Start Strataderm grt apply to affected skin on the nose twice a day Recommend N-acetylcysteine (NAC) 600 mg supplement three times per day to help with picking  Continue Opezlura cream apply to affected skin once a day  May consider adding Wellbutrin in the future   Chronic and persistent condition with duration over one year. Condition is symptomatic / bothersome to patient. Not to goal.  Related Medications Wound Dressings (STRATA GRT) GEL Apply to affected area on the nose twice a day  Picking Habit (OCD vs other) Cover area(s) when he can.  Avoid touching and picking.   Recommend N-acetylcysteine (NAC) 600 mg supplement three times per day to help with picking May consider Wellbutrin in the future to help control the habit of picking. Consider Psych evaluation in future.   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  - Call if any new or changing lesions are noted between office visits  Rosacea Head - Anterior (Face) Rosacea is a chronic progressive skin condition usually affecting the face of adults, causing redness and/or acne bumps. It is treatable but not curable. It sometimes affects the eyes (ocular rosacea) as well. It  may respond to topical and/or systemic medication and can flare with stress, sun exposure, alcohol, exercise and some foods.  Daily application of broad spectrum spf 30+ sunscreen to face is recommended to reduce flares.   Start Doxycycline 20 mg take 2 tablet once a day with food #60 2RF  Start skin medicinals Rosacea triple cream apply to face once a day   Doxycycline should be taken with food to prevent nausea. Do not lay down for 30 minutes after taking. Be cautious with sun exposure and use good  sun protection while on this medication. Pregnant women should not take this medication.    Related Medications doxycycline (PERIOSTAT) 20 MG tablet Take 2 tablets  once a day with food and water  Actinic Damage - chronic, secondary to cumulative UV radiation exposure/sun exposure over time - diffuse scaly erythematous macules with underlying dyspigmentation - Recommend daily broad spectrum sunscreen SPF 30+ to sun-exposed areas, reapply every 2 hours as needed.  - Recommend staying in the shade or wearing long sleeves, sun glasses (UVA+UVB protection) and wide brim hats (4-inch brim around the entire circumference of the hat). - Call for new or changing lesions.  Return in about 4 weeks (around 06/28/2022) for [facility], Hx of AK, Rosacea .  IMarye Round, CMA, am acting as scribe for Sarina Ser, MD .  Documentation: I have reviewed the above documentation for accuracy and completeness, and I agree with the above.  Sarina Ser, MD

## 2022-06-22 ENCOUNTER — Other Ambulatory Visit: Payer: Self-pay

## 2022-06-22 DIAGNOSIS — L719 Rosacea, unspecified: Secondary | ICD-10-CM

## 2022-06-22 MED ORDER — DOXYCYCLINE HYCLATE 20 MG PO TABS: ORAL_TABLET | ORAL | 0 refills | Status: DC

## 2022-06-22 NOTE — Progress Notes (Signed)
[facility] faxed over a RX request for a 90day supply, insurance preference. Escripted in

## 2022-07-15 ENCOUNTER — Ambulatory Visit: Payer: BC Managed Care – PPO | Admitting: Dermatology

## 2022-07-28 ENCOUNTER — Ambulatory Visit: Payer: BC Managed Care – PPO | Admitting: Dermatology

## 2022-07-28 DIAGNOSIS — L573 Poikiloderma of Civatte: Secondary | ICD-10-CM | POA: Diagnosis not present

## 2022-07-28 DIAGNOSIS — B360 Pityriasis versicolor: Secondary | ICD-10-CM | POA: Diagnosis not present

## 2022-07-28 DIAGNOSIS — L719 Rosacea, unspecified: Secondary | ICD-10-CM | POA: Diagnosis not present

## 2022-07-28 DIAGNOSIS — F424 Excoriation (skin-picking) disorder: Secondary | ICD-10-CM

## 2022-07-28 DIAGNOSIS — L280 Lichen simplex chronicus: Secondary | ICD-10-CM

## 2022-07-28 MED ORDER — KETOCONAZOLE 2 % EX SHAM: 1.0000 | MEDICATED_SHAMPOO | CUTANEOUS | 6 refills | Status: AC

## 2022-07-28 NOTE — Progress Notes (Signed)
Follow-Up Visit   Subjective  Douglas Vega is a 56 y.o. male who presents for the following: Follow-up ([place] follow up of nose in site of biopsy proven AK - Tried N-acetylcysteine for 1 month but did not think it helped much so he stopped. Using [facility] triple cream all over his nose) and Rosacea (Doxycycline 20 mg qd, [facility] triple cream). Also wonders about redness of neck. Complains of itch of back.  The following portions of the chart were reviewed this encounter and updated as appropriate:   Tobacco  Allergies  Meds  Problems  Med Hx  Surg Hx  Fam Hx     Review of Systems:  No other skin or systemic complaints except as noted in HPI or Assessment and Plan.  Objective  Well appearing patient in no apparent distress; mood and affect are within normal limits.  A focused examination was performed including face, neck, back. Relevant physical exam findings are noted in the Assessment and Plan.  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  Typically we recommend at least 6 treatment sessions for the neck about 5-8 weeks apart for best results.  The patient's condition may require "maintenance treatments" in the future.  The fee for BBL / laser treatments is $350 per treatment session for the whole face.  A fee can be quoted for other parts of the body. Insurance typically does not pay for BBL/laser treatments and therefore the fee is an out-of-pocket cost.  Tinea versicolor Back Start Ketoconazole 2% shampoo - wash scalp, neck and back 2 times per week for 2 months. May use monthly for prevention.  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  It is generally asymptomatic. The rash is due to overgrowth of a common type of yeast present on  everyone's skin and it is not contagious.  It tends to flare more in the summer due to increased sweating on trunk.  After rash is treated, the scaliness will resolve, but the discoloration will take longer to return to normal pigmentation. The periodic use of an OTC medicated soap/shampoo with zinc or selenium sulfide can be helpful to prevent yeast overgrowth and recurrence.  ketoconazole (NIZORAL) 2 % shampoo - Back Apply 1 Application topically as directed. Wash scalp, neck, back 2 times per week foe 2 months. May use monthly for prevention.  Lichen simplex chronicus Left Tip of Nose See photos Lichen simplex chronicus - persistent, (but improved compared to last visit - see photos) - likely due to chronic and frequent factitial manipulation (picking; rubbing; shaving with razor) admitted to by pt. Patient does admit to rubbing picking and shaving with a razor at the spots on his nose that tends to get thick and scaly and rough and scabbed. This scaly roughness tends to make him want to pick more.  He knows it is a habit and he tries to be conscientious about it. He states he has had a habit of picking on his face causing scabs since a teenager.  He tends to do it subconsciously when he is deep in thought, but does know he is doing it.   He is willing to get psychiatric help for this habit if needed.  Above [facility] and crusts are within and adjacent to:  Bx  proven AK with excoriation of nasal tip (01/15/2020). Hx of previous treatment with LN2 and 5FU treatment multiple times.   Dr Laurence Ferrari has seen pt with me within the past year and evaluated with dermoscopy with "benign" appearance. Dr Nicole Kindred evaluated the patient last visit with me and agreed with plan. Plan from last visit 05/31/2022: Start Strataderm grt apply to affected skin on the nose twice a day Recommend N-acetylcysteine (NAC) 600 mg supplement three times per day to help with picking  Continue Opezlura cream apply to affected skin once a  day  May consider adding Wellbutrin in the future  Chronic and persistent condition with duration over one year. Condition is symptomatic / bothersome to patient. Not to goal.  Discussed [place] steroid injection today. May need additional injections in future.  Intralesional injection - Left Tip of Nose Location: Left nose  Informed Consent: Discussed risks (infection, pain, bleeding, bruising, thinning of the skin, loss of skin pigment, lack of resolution, and recurrence of lesion) and benefits of the procedure, as well as the alternatives. Informed consent was obtained. Preparation: The area was prepared a standard fashion.  Anesthesia: none  Procedure Details: An intralesional injection was performed with Kenalog 1.25 mg/cc. 0.1 cc in total were injected. [place] [id number]  Lot #[id number] Exp 07/2023  Total number of injections: 2  Plan: The patient was instructed on post-op care. Recommend OTC analgesia as needed for pain.  Related Medications Wound Dressings (STRATA GRT) GEL Apply to affected area on the nose twice a day  Rosacea Face Rosacea is a chronic progressive skin condition usually affecting the face of adults, causing redness and/or acne bumps. It is treatable but not curable. It sometimes affects the eyes (ocular rosacea) as well. It may respond to topical and/or systemic medication and can flare with stress, sun exposure, alcohol, exercise and some foods.  Daily application of broad spectrum spf 30+ sunscreen to face is recommended to reduce flares.  Well controlled. Continue Doxycycline 20 mg 1 po qd, [facility] Triple cream qd-bid  Related Medications doxycycline (PERIOSTAT) 20 MG tablet Take 2 tablets  once a day with food and water  Picking Habit (OCD vs other) Cover area(s) when he can.  Avoid touching and picking.   Recommend N-acetylcysteine (NAC) 600 mg supplement three times per day to help with picking May consider Wellbutrin in the future to help control  the habit of picking. Consider Psych evaluation in future.  Return in about 1 month (around 08/27/2022) for Follow up.  I, Ashok Cordia, CMA, am acting as scribe for Sarina Ser, MD . Documentation: I have reviewed the above documentation for accuracy and completeness, and I agree with the above.  Sarina Ser, MD

## 2022-07-28 NOTE — Patient Instructions (Signed)
Discussed the treatment option of BBL/laser.  Typically we recommend 1-3 treatment sessions about 5-8 weeks apart for best results.  The patient's condition may require "maintenance treatments" in the future.  The fee for BBL / laser treatments is $350 per treatment session for the whole face.  A fee can be quoted for other parts of the body. Insurance typically does not pay for BBL/laser treatments and therefore the fee is an out-of-pocket cost.  Due to recent changes in healthcare laws, you may see results of your pathology and/or laboratory studies on MyChart before the doctors have had a chance to review them. We understand that in some cases there may be results that are confusing or concerning to you. Please understand that not all results are received at the same time and often the doctors may need to interpret multiple results in order to provide you with the best plan of care or course of treatment. Therefore, we ask that you please give [place] 2 business days to thoroughly review all your results before contacting the office for clarification. Should we see a critical lab result, you will be contacted sooner.   If You Need Anything After Your Visit  If you have any questions or concerns for your doctor, please call our main line at [phone] and press option 4 to reach your doctor's medical assistant. If no one answers, please leave a voicemail as directed and we will return your call as soon as possible. Messages left after 4 pm will be answered the following business day.   You may also send [place] a message via [place]. We typically respond to MyChart messages within 1-2 business days.  For prescription refills, please ask your pharmacy to contact our office. Our fax number is [phone].  If you have an urgent issue when the clinic is closed that cannot wait until the next business day, you can page your doctor at the number below.    Please note that while we do our best to be available  for urgent issues outside of office hours, we are not available 24/7.   If you have an urgent issue and are unable to reach [place], you may choose to seek medical care at your doctor's office, retail clinic, urgent care center, or emergency room.  If you have a medical emergency, please immediately call 911 or go to the emergency department.  Pager Numbers  - Dr. Nehemiah Massed: [phone]  - Dr. Laurence Ferrari: [phone]  - Dr. Nicole Kindred: [phone]  In the event of inclement weather, please call our main line at [phone] for an update on the status of any delays or closures.  Dermatology Medication Tips: Please keep the boxes that topical medications come in in order to help keep track of the instructions about where and how to use these. Pharmacies typically print the medication instructions only on the boxes and not directly on the medication tubes.   If your medication is too expensive, please contact our office at [phone] option 4 or send [place] a message through [place].   We are unable to tell what your co-pay for medications will be in advance as this is different depending on your insurance coverage. However, we may be able to find a substitute medication at lower cost or fill out paperwork to get insurance to cover a needed medication.   If a prior authorization is required to get your medication covered by your insurance company, please allow [place] 1-2 business days to complete this process.  Drug prices  often vary depending on where the prescription is filled and some pharmacies may offer cheaper prices.  The website www.goodrx.com contains coupons for medications through different pharmacies. The prices here do not account for what the cost may be with help from insurance (it may be cheaper with your insurance), but the website can give you the price if you did not use any insurance.  - You can print the associated coupon and take it with your prescription to the pharmacy.  - You may  also stop by our office during regular business hours and pick up a GoodRx coupon card.  - If you need your prescription sent electronically to a different pharmacy, notify our office through [facility] or by phone at [phone] option 4.     Si Usted Necesita Algo Despus de Su Visita  Tambin puede enviarnos un mensaje a travs de Pharmacist, community. Por lo general respondemos a los mensajes de MyChart en el transcurso de 1 a 2 das hbiles.  Para renovar recetas, por favor pida a su farmacia que se ponga en contacto con nuestra oficina. Harland Dingwall de fax es [place] [phone].  Si tiene un asunto urgente cuando la clnica est cerrada y que no puede esperar hasta el siguiente da hbil, puede llamar/localizar a su doctor(a) al nmero que aparece a continuacin.   Por favor, tenga en cuenta que aunque hacemos todo lo posible para estar disponibles para asuntos urgentes fuera del horario de [place], no estamos disponibles las 24 horas del da, los 7 das de la [place].   Si tiene un problema urgente y no puede comunicarse con nosotros, puede optar por buscar atencin mdica  en el consultorio de su doctor(a), en una clnica privada, en un centro de atencin urgente o en una sala de emergencias.  Si tiene Engineering geologist, por favor llame inmediatamente al 911 o vaya a la sala de emergencias.  Nmeros de bper  - Dr. Nehemiah Massed: [phone]  - Dra. Moye: [phone]  - Dra. Nicole Kindred: [phone]  En caso de inclemencias del [place], por favor llame a Johnsie Kindred principal al [phone] para una actualizacin sobre el [place] de cualquier retraso o cierre.  Consejos para la medicacin en dermatologa: Por favor, guarde las cajas en las que vienen los medicamentos de uso tpico para ayudarle a seguir las instrucciones sobre dnde y cmo usarlos. Las farmacias generalmente imprimen las instrucciones del medicamento slo en las cajas y no directamente en los tubos del [place].    Si su medicamento es muy caro, por favor, pngase en contacto con Zigmund Daniel llamando al [phone] y presione la opcin 4 o envenos un mensaje a travs de Pharmacist, community.   No podemos decirle cul ser su copago por los medicamentos por adelantado ya que esto es diferente dependiendo de la cobertura de su seguro. Sin embargo, es posible que podamos encontrar un medicamento sustituto a Electrical engineer un formulario para que el seguro cubra el medicamento que se considera necesario.   Si se requiere una autorizacin previa para que su compaa de seguros [place] su medicamento, por favor permtanos de 1 a 2 das hbiles para completar este proceso.  Los precios de los medicamentos varan con frecuencia dependiendo del Environmental consultant de dnde se surte la receta y alguna farmacias pueden ofrecer precios ms baratos.  El sitio web www.goodrx.com tiene cupones para medicamentos de Airline pilot. Los precios aqu no tienen en cuenta lo que podra costar con la ayuda del seguro (puede ser ms barato con  su seguro), pero el sitio web puede darle el precio si no Field seismologist.  - Puede imprimir el cupn correspondiente y llevarlo con su receta a la farmacia.  - Tambin puede pasar por nuestra oficina durante el horario de atencin regular y Charity fundraiser una tarjeta de cupones de GoodRx.  - Si necesita que su receta se enve electrnicamente a una farmacia diferente, informe a nuestra oficina a travs de MyChart de [place] o por telfono llamando al [phone] y presione la opcin 4.

## 2022-07-30 ENCOUNTER — Encounter: Payer: Self-pay | Admitting: Dermatology

## 2022-09-01 ENCOUNTER — Ambulatory Visit: Payer: BC Managed Care – PPO | Admitting: Dermatology

## 2022-09-01 DIAGNOSIS — L280 Lichen simplex chronicus: Secondary | ICD-10-CM

## 2022-09-01 DIAGNOSIS — Z7189 Other specified counseling: Secondary | ICD-10-CM

## 2022-09-01 NOTE — Patient Instructions (Signed)
Due to recent changes in healthcare laws, you may see results of your pathology and/or laboratory studies on MyChart before the doctors have had a chance to review them. We understand that in some cases there may be results that are confusing or concerning to you. Please understand that not all results are received at the same time and often the doctors may need to interpret multiple results in order to provide you with the best plan of care or course of treatment. Therefore, we ask that you please give us 2 business days to thoroughly review all your results before contacting the office for clarification. Should we see a critical lab result, you will be contacted sooner.   If You Need Anything After Your Visit  If you have any questions or concerns for your doctor, please call our main line at [phone] and press option 4 to reach your doctor's medical assistant. If no one answers, please leave a voicemail as directed and we will return your call as soon as possible. Messages left after 4 pm will be answered the following business day.   You may also send us a message via MyChart. We typically respond to MyChart messages within 1-2 business days.  For prescription refills, please ask your pharmacy to contact our office. Our fax number is [phone].  If you have an urgent issue when the clinic is closed that cannot wait until the next business day, you can page your doctor at the number below.    Please note that while we do our best to be available for urgent issues outside of office hours, we are not available 24/7.   If you have an urgent issue and are unable to reach us, you may choose to seek medical care at your doctor's office, retail clinic, urgent care center, or emergency room.  If you have a medical emergency, please immediately call 911 or go to the emergency department.  Pager Numbers  - Dr. Kowalski: [phone]  - Dr. Moye: [phone]  - Dr. Stewart:  [phone]  In the event of inclement weather, please call our main line at [phone] for an update on the status of any delays or closures.  Dermatology Medication Tips: Please keep the boxes that topical medications come in in order to help keep track of the instructions about where and how to use these. Pharmacies typically print the medication instructions only on the boxes and not directly on the medication tubes.   If your medication is too expensive, please contact our office at [phone] option 4 or send us a message through MyChart.   We are unable to tell what your co-pay for medications will be in advance as this is different depending on your insurance coverage. However, we may be able to find a substitute medication at lower cost or fill out paperwork to get insurance to cover a needed medication.   If a prior authorization is required to get your medication covered by your insurance company, please allow us 1-2 business days to complete this process.  Drug prices often vary depending on where the prescription is filled and some pharmacies may offer cheaper prices.  The website www.goodrx.com contains coupons for medications through different pharmacies. The prices here do not account for what the cost may be with help from insurance (it may be cheaper with your insurance), but the website can give you the price if you did not use any insurance.  - You can print the associated coupon and take it with   your prescription to the pharmacy.  - You may also stop by our office during regular business hours and pick up a GoodRx coupon card.  - If you need your prescription sent electronically to a different pharmacy, notify our office through [place] MyChart or by phone at [phone] option 4.     Si Usted Necesita Algo Despus de Su Visita  Tambin puede enviarnos un mensaje a travs de MyChart. Por lo general respondemos a los mensajes de MyChart en el transcurso de 1 a 2  das hbiles.  Para renovar recetas, por favor pida a su farmacia que se ponga en contacto con nuestra oficina. Nuestro nmero de fax es el [phone].  Si tiene un asunto urgente cuando la clnica est cerrada y que no puede esperar hasta el siguiente da hbil, puede llamar/localizar a su doctor(a) al nmero que aparece a continuacin.   Por favor, tenga en cuenta que aunque hacemos todo lo posible para estar disponibles para asuntos urgentes fuera del horario de oficina, no estamos disponibles las 24 horas del da, los 7 das de la semana.   Si tiene un problema urgente y no puede comunicarse con nosotros, puede optar por buscar atencin mdica  en el consultorio de su doctor(a), en una clnica privada, en un centro de atencin urgente o en una sala de emergencias.  Si tiene una emergencia mdica, por favor llame inmediatamente al 911 o vaya a la sala de emergencias.  Nmeros de bper  - Dr. Kowalski: [phone]  - Dra. Moye: [phone]  - Dra. Stewart: [phone]  En caso de inclemencias del tiempo, por favor llame a nuestra lnea principal al [phone] para una actualizacin sobre el estado de cualquier retraso o cierre.  Consejos para la medicacin en dermatologa: Por favor, guarde las cajas en las que vienen los medicamentos de uso tpico para ayudarle a seguir las instrucciones sobre dnde y cmo usarlos. Las farmacias generalmente imprimen las instrucciones del medicamento slo en las cajas y no directamente en los tubos del medicamento.   Si su medicamento es muy caro, por favor, pngase en contacto con nuestra oficina llamando al [phone] y presione la opcin 4 o envenos un mensaje a travs de MyChart.   No podemos decirle cul ser su copago por los medicamentos por adelantado ya que esto es diferente dependiendo de la cobertura de su seguro. Sin embargo, es posible que podamos encontrar un medicamento sustituto a menor costo o llenar un formulario para que el  seguro cubra el medicamento que se considera necesario.   Si se requiere una autorizacin previa para que su compaa de seguros cubra su medicamento, por favor permtanos de 1 a 2 das hbiles para completar este proceso.  Los precios de los medicamentos varan con frecuencia dependiendo del lugar de dnde se surte la receta y alguna farmacias pueden ofrecer precios ms baratos.  El sitio web www.goodrx.com tiene cupones para medicamentos de diferentes farmacias. Los precios aqu no tienen en cuenta lo que podra costar con la ayuda del seguro (puede ser ms barato con su seguro), pero el sitio web puede darle el precio si no utiliz ningn seguro.  - Puede imprimir el cupn correspondiente y llevarlo con su receta a la farmacia.  - Tambin puede pasar por nuestra oficina durante el horario de atencin regular y recoger una tarjeta de cupones de GoodRx.  - Si necesita que su receta se enve electrnicamente a una farmacia diferente, informe a nuestra oficina a travs de MyChart de [place]   o por telfono llamando al [phone] y presione la opcin 4.  

## 2022-09-01 NOTE — Progress Notes (Signed)
Follow-Up Visit   Subjective  Douglas Vega is a 56 y.o. male who presents for the following: Follow-up (LSC 1 month follow up - IL injection, NAC qd, Opzelura qd, Strata grt qd). He seems a little better.  He admits to still picking, but not as much?  The following portions of the chart were reviewed this encounter and updated as appropriate:   Tobacco  Allergies  Meds  Problems  Med Hx  Surg Hx  Fam Hx     Review of Systems:  No other skin or systemic complaints except as noted in HPI or Assessment and Plan.  Objective  Well appearing patient in no apparent distress; mood and affect are within normal limits.  A focused examination was performed including face. Relevant physical exam findings are noted in the Assessment and Plan.   Assessment & Plan  Lichen simplex chronicus Left tip of nose Lichen simplex chronicus - persistent, (but improved compared to last visit - see photos) - likely due to chronic and frequent factitial manipulation (picking; rubbing; shaving with razor) admitted to by pt. Patient does admit to rubbing picking and shaving with a razor at the spots on his nose that tends to get thick and scaly and rough and scabbed. This scaly roughness tends to make him want to pick more.  He knows it is a habit and he feels like he is getting better with avoiding picking the area. He states he has had a habit of picking on his face causing scabs since a teenager.  He tends to do it subconsciously when he is deep in thought, but does know he is doing it.   He is willing to get psychiatric help for this habit if needed.   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 Hx of previous treatment with LN2 and 5FU treatment multiple times.   Dr Laurence Ferrari has seen pt with me within the past year and evaluated with dermoscopy with "benign" appearance. Dr Nicole Kindred evaluated the patient last visit with me and agreed with  plan.  Continue Strataderm grt apply to affected skin on the nose twice a day Continue N-acetylcysteine (NAC) 600 mg supplement three times per day to help with picking  Continue Opezlura cream apply to affected skin once a day  May consider adding Wellbutrin in the future - recommend he discuss starting this with Dr. Frazier Richards.  If Dr Ouida Sills not comfortable prescribing an SSRI or appropriate medication for this, will consider Psychiatry consult - pt is good with this.  Chronic and persistent condition with duration over one year. Condition is symptomatic / bothersome to patient. Not to goal.   Discussed IL steroid injection today. May need additional injections in future.   Intralesional injection - Left tip of nose Location: left tip of nose  Informed Consent: Discussed risks (infection, pain, bleeding, bruising, thinning of the skin, loss of skin pigment, lack of resolution, and recurrence of lesion) and benefits of the procedure, as well as the alternatives. Informed consent was obtained. Preparation: The area was prepared a standard fashion.  Anesthesia:none  Procedure Details: An intralesional injection was performed with Kenalog 2 mg/cc. 0.05 cc in total were injected.  Total number of injections: 1  Plan: The patient was instructed on post-op care. Recommend OTC analgesia as needed for pain.  Related Medications Wound Dressings (STRATA GRT) GEL Apply to affected area on the nose twice a day  Return  for 4-6 weeks , Follow up.  I, Ashok Cordia, CMA, am acting as scribe for Sarina Ser, MD . Documentation: I have reviewed the above documentation for accuracy and completeness, and I agree with the above.  Sarina Ser, MD

## 2022-09-07 ENCOUNTER — Encounter: Payer: Self-pay | Admitting: Dermatology

## 2022-09-27 ENCOUNTER — Other Ambulatory Visit: Payer: Self-pay | Admitting: Dermatology

## 2022-09-27 DIAGNOSIS — L719 Rosacea, unspecified: Secondary | ICD-10-CM

## 2022-10-11 ENCOUNTER — Ambulatory Visit: Payer: BC Managed Care – PPO | Admitting: Dermatology

## 2022-10-11 DIAGNOSIS — L280 Lichen simplex chronicus: Secondary | ICD-10-CM | POA: Diagnosis not present

## 2022-10-11 DIAGNOSIS — Z872 Personal history of diseases of the skin and subcutaneous tissue: Secondary | ICD-10-CM

## 2022-10-11 NOTE — Progress Notes (Signed)
   Follow-Up Visit   Subjective  Douglas Vega is a 56 y.o. male who presents for the following: LSC (Of the L nasal tip - improved since starting ILK injections, currently using Opzelura cream and Mometasone cream PRN).  The following portions of the chart were reviewed this encounter and updated as appropriate:   Tobacco  Allergies  Meds  Problems  Med Hx  Surg Hx  Fam Hx     Review of Systems:  No other skin or systemic complaints except as noted in HPI or Assessment and Plan.  Objective  Well appearing patient in no apparent distress; mood and affect are within normal limits.  A focused examination was performed including the face. Relevant physical exam findings are noted in the Assessment and Plan.  L nasal tip Slight roughness at the L underside nasal tip where the cartilage is more prominent.   Assessment & Plan  Lichen simplex chronicus L nasal tip  Lichen simplex chronicus - persistent, (but improved compared to last visit - see photos) - likely due to chronic and frequent factitial manipulation (picking; rubbing; shaving with razor) admitted to by pt. Chronic and persistent condition with duration over one year. Condition is symptomatic / bothersome to patient. Not to goal. Improved since previous ILK injection.  Pt so significantly improved today that appears mostly clear.  I do not feel he needs repeat ILK and would recommend he continue to be vigilant about keeping his hands off area and continue topical treatment below.  Patient does admit to rubbing picking and shaving with a razor at the spots on his nose that tends to get thick and scaly and rough and scabbed. This scaly roughness tends to make him want to pick more.  He knows it is a habit and he feels like he is getting better with avoiding picking the area. He states he has had a habit of picking on his face causing scabs since a teenager.  He tends to do it subconsciously when he is deep in thought, but  does know he is doing it.   He is willing to get psychiatric help for this habit if needed.   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 Hx of previous treatment with LN2 and 5FU treatment multiple times.   Dr Laurence Ferrari has seen pt with me within the past year and evaluated with dermoscopy with "benign" appearance. Dr Nicole Kindred evaluated the patient on 05/31/2022 visit with me and agreed with plan.   Continue Strataderm grt apply to affected skin on the nose twice a day Continue N-acetylcysteine (NAC) 600 mg supplement three times per day to help with picking  Continue Opezlura cream apply to affected skin once a day  May consider adding Wellbutrin in the future - recommend he discuss starting this with Dr. Frazier Richards.  If Dr Ouida Sills not comfortable prescribing an SSRI or appropriate medication for this, will consider Psychiatry consult - pt is good with this. Continue Mometasone 0.1% cream to aa's 3d/wk PRN.   Return for [facility] follow up in 3 mths.  Luther Redo, CMA, am acting as scribe for Sarina Ser, MD . Documentation: I have reviewed the above documentation for accuracy and completeness, and I agree with the above.  Sarina Ser, MD

## 2022-10-11 NOTE — Patient Instructions (Signed)
Due to recent changes in healthcare laws, you may see results of your pathology and/or laboratory studies on MyChart before the doctors have had a chance to review them. We understand that in some cases there may be results that are confusing or concerning to you. Please understand that not all results are received at the same time and often the doctors may need to interpret multiple results in order to provide you with the best plan of care or course of treatment. Therefore, we ask that you please give us 2 business days to thoroughly review all your results before contacting the office for clarification. Should we see a critical lab result, you will be contacted sooner.   If You Need Anything After Your Visit  If you have any questions or concerns for your doctor, please call our main line at [phone] and press option 4 to reach your doctor's medical assistant. If no one answers, please leave a voicemail as directed and we will return your call as soon as possible. Messages left after 4 pm will be answered the following business day.   You may also send us a message via MyChart. We typically respond to MyChart messages within 1-2 business days.  For prescription refills, please ask your pharmacy to contact our office. Our fax number is [phone].  If you have an urgent issue when the clinic is closed that cannot wait until the next business day, you can page your doctor at the number below.    Please note that while we do our best to be available for urgent issues outside of office hours, we are not available 24/7.   If you have an urgent issue and are unable to reach us, you may choose to seek medical care at your doctor's office, retail clinic, urgent care center, or emergency room.  If you have a medical emergency, please immediately call 911 or go to the emergency department.  Pager Numbers  - Dr. Kowalski: [phone]  - Dr. Moye: [phone]  - Dr. Stewart:  [phone]  In the event of inclement weather, please call our main line at [phone] for an update on the status of any delays or closures.  Dermatology Medication Tips: Please keep the boxes that topical medications come in in order to help keep track of the instructions about where and how to use these. Pharmacies typically print the medication instructions only on the boxes and not directly on the medication tubes.   If your medication is too expensive, please contact our office at [phone] option 4 or send us a message through MyChart.   We are unable to tell what your co-pay for medications will be in advance as this is different depending on your insurance coverage. However, we may be able to find a substitute medication at lower cost or fill out paperwork to get insurance to cover a needed medication.   If a prior authorization is required to get your medication covered by your insurance company, please allow us 1-2 business days to complete this process.  Drug prices often vary depending on where the prescription is filled and some pharmacies may offer cheaper prices.  The website www.goodrx.com contains coupons for medications through different pharmacies. The prices here do not account for what the cost may be with help from insurance (it may be cheaper with your insurance), but the website can give you the price if you did not use any insurance.  - You can print the associated coupon and take it with   your prescription to the pharmacy.  - You may also stop by our office during regular business hours and pick up a GoodRx coupon card.  - If you need your prescription sent electronically to a different pharmacy, notify our office through [place] MyChart or by phone at [phone] option 4.     Si Usted Necesita Algo Despus de Su Visita  Tambin puede enviarnos un mensaje a travs de MyChart. Por lo general respondemos a los mensajes de MyChart en el transcurso de 1 a 2  das hbiles.  Para renovar recetas, por favor pida a su farmacia que se ponga en contacto con nuestra oficina. Nuestro nmero de fax es el [phone].  Si tiene un asunto urgente cuando la clnica est cerrada y que no puede esperar hasta el siguiente da hbil, puede llamar/localizar a su doctor(a) al nmero que aparece a continuacin.   Por favor, tenga en cuenta que aunque hacemos todo lo posible para estar disponibles para asuntos urgentes fuera del horario de oficina, no estamos disponibles las 24 horas del da, los 7 das de la semana.   Si tiene un problema urgente y no puede comunicarse con nosotros, puede optar por buscar atencin mdica  en el consultorio de su doctor(a), en una clnica privada, en un centro de atencin urgente o en una sala de emergencias.  Si tiene una emergencia mdica, por favor llame inmediatamente al 911 o vaya a la sala de emergencias.  Nmeros de bper  - Dr. Kowalski: [phone]  - Dra. Moye: [phone]  - Dra. Stewart: [phone]  En caso de inclemencias del tiempo, por favor llame a nuestra lnea principal al [phone] para una actualizacin sobre el estado de cualquier retraso o cierre.  Consejos para la medicacin en dermatologa: Por favor, guarde las cajas en las que vienen los medicamentos de uso tpico para ayudarle a seguir las instrucciones sobre dnde y cmo usarlos. Las farmacias generalmente imprimen las instrucciones del medicamento slo en las cajas y no directamente en los tubos del medicamento.   Si su medicamento es muy caro, por favor, pngase en contacto con nuestra oficina llamando al [phone] y presione la opcin 4 o envenos un mensaje a travs de MyChart.   No podemos decirle cul ser su copago por los medicamentos por adelantado ya que esto es diferente dependiendo de la cobertura de su seguro. Sin embargo, es posible que podamos encontrar un medicamento sustituto a menor costo o llenar un formulario para que el  seguro cubra el medicamento que se considera necesario.   Si se requiere una autorizacin previa para que su compaa de seguros cubra su medicamento, por favor permtanos de 1 a 2 das hbiles para completar este proceso.  Los precios de los medicamentos varan con frecuencia dependiendo del lugar de dnde se surte la receta y alguna farmacias pueden ofrecer precios ms baratos.  El sitio web www.goodrx.com tiene cupones para medicamentos de diferentes farmacias. Los precios aqu no tienen en cuenta lo que podra costar con la ayuda del seguro (puede ser ms barato con su seguro), pero el sitio web puede darle el precio si no utiliz ningn seguro.  - Puede imprimir el cupn correspondiente y llevarlo con su receta a la farmacia.  - Tambin puede pasar por nuestra oficina durante el horario de atencin regular y recoger una tarjeta de cupones de GoodRx.  - Si necesita que su receta se enve electrnicamente a una farmacia diferente, informe a nuestra oficina a travs de MyChart de [place]   o por telfono llamando al [phone] y presione la opcin 4.  

## 2022-10-27 ENCOUNTER — Encounter: Payer: Self-pay | Admitting: Dermatology

## 2023-01-13 ENCOUNTER — Ambulatory Visit: Payer: BC Managed Care – PPO | Admitting: Dermatology

## 2023-08-30 ENCOUNTER — Ambulatory Visit: Payer: BC Managed Care – PPO | Admitting: Dermatology

## 2024-05-01 ENCOUNTER — Telehealth: Payer: Self-pay

## 2024-05-01 ENCOUNTER — Ambulatory Visit: Payer: BC Managed Care – PPO | Admitting: Dermatology

## 2024-05-01 ENCOUNTER — Encounter: Payer: Self-pay | Admitting: Dermatology

## 2024-05-01 DIAGNOSIS — L820 Inflamed seborrheic keratosis: Secondary | ICD-10-CM

## 2024-05-01 DIAGNOSIS — L570 Actinic keratosis: Secondary | ICD-10-CM

## 2024-05-01 DIAGNOSIS — Z85828 Personal history of other malignant neoplasm of skin: Secondary | ICD-10-CM

## 2024-05-01 DIAGNOSIS — W908XXA Exposure to other nonionizing radiation, initial encounter: Secondary | ICD-10-CM

## 2024-05-01 DIAGNOSIS — Z872 Personal history of diseases of the skin and subcutaneous tissue: Secondary | ICD-10-CM

## 2024-05-01 DIAGNOSIS — L578 Other skin changes due to chronic exposure to nonionizing radiation: Secondary | ICD-10-CM

## 2024-05-01 DIAGNOSIS — R234 Changes in skin texture: Secondary | ICD-10-CM

## 2024-05-01 DIAGNOSIS — L281 Prurigo nodularis: Secondary | ICD-10-CM

## 2024-05-01 DIAGNOSIS — Z7189 Other specified counseling: Secondary | ICD-10-CM

## 2024-05-01 DIAGNOSIS — Z5111 Encounter for antineoplastic chemotherapy: Secondary | ICD-10-CM

## 2024-05-01 DIAGNOSIS — L280 Lichen simplex chronicus: Secondary | ICD-10-CM

## 2024-05-01 DIAGNOSIS — Z79899 Other long term (current) drug therapy: Secondary | ICD-10-CM

## 2024-05-01 MED ORDER — FLUOROURACIL 5 % EX CREA: TOPICAL_CREAM | CUTANEOUS | 1 refills | Status: AC

## 2024-05-01 NOTE — Telephone Encounter (Signed)
 My chart message to patient

## 2024-05-01 NOTE — Patient Instructions (Addendum)
 Instructions for [facility] Medications  One or more of your medications was sent to the [facility] mail order compounding pharmacy. You will receive an email from them and can purchase the medicine through that link. It will then be mailed to your home at the address you confirmed. If for any reason you do not receive an email from them, please check your spam folder. If you still do not find the email, please let us  know. [facility] phone number is [phone].     5-Fluorouracil /Calcipotriene  Patient Education   Actinic keratoses are the dry, red scaly spots on the skin caused by sun damage. A portion of these spots can turn into skin cancer with time, and treating them can help prevent development of skin cancer.   Treatment of these spots requires removal of the defective skin cells. There are various ways to remove actinic keratoses, including freezing with liquid nitrogen, treatment with creams, or treatment with a blue light procedure in the office.   5-fluorouracil  cream is a topical cream used to treat actinic keratoses. It works by interfering with the growth of abnormal fast-growing skin cells, such as actinic keratoses. These cells peel off and are replaced by healthy ones.   5-fluorouracil /calcipotriene  is a combination of the 5-fluorouracil  cream with a vitamin D  analog cream called calcipotriene . The calcipotriene  alone does not treat actinic keratoses. However, when it is combined with 5-fluorouracil , it helps the 5-fluorouracil  treat the actinic keratoses much faster so that the same results can be achieved with a much shorter treatment time.  INSTRUCTIONS FOR 5-FLUOROURACIL /CALCIPOTRIENE  CREAM:   5-fluorouracil /calcipotriene  cream typically only needs to be used for 4-7 days. A thin layer should be applied twice a day to the treatment areas recommended by your physician.   If your physician prescribed you separate tubes of 5-fluourouracil and calcipotriene ,  apply a thin layer of 5-fluorouracil  followed by a thin layer of calcipotriene .   Avoid contact with your eyes, nostrils, and mouth. Do not use 5-fluorouracil /calcipotriene  cream on infected or open wounds.   You will develop redness, irritation and some crusting at areas where you have pre-cancer damage/actinic keratoses. IF YOU DEVELOP PAIN, BLEEDING, OR SIGNIFICANT CRUSTING, STOP THE TREATMENT EARLY - you have already gotten a good response and the actinic keratoses should clear up well.  Wash your hands after applying 5-fluorouracil  5% cream on your skin.   A moisturizer or sunscreen with a minimum SPF 30 should be applied each morning.   Once you have finished the treatment, you can apply a thin layer of Vaseline twice a day to irritated areas to soothe and calm the areas more quickly. If you experience significant discomfort, contact your physician.  For some patients it is necessary to repeat the treatment for best results.  SIDE EFFECTS: When using 5-fluorouracil /calcipotriene  cream, you may have mild irritation, such as redness, dryness, swelling, or a mild burning sensation. This usually resolves within 2 weeks. The more actinic keratoses you have, the more redness and inflammation you can expect during treatment. Eye irritation has been reported rarely. If this occurs, please let us  know.  If you have any trouble using this cream, please call the office. If you have any other questions about this information, please do not hesitate to ask me before you leave the office.      Due to recent changes in healthcare laws, you may see results of your pathology and/or laboratory studies on MyChart before the doctors have had a chance to review them. We  understand that in some cases there may be results that are confusing or concerning to you. Please understand that not all results are received at the same time and often the doctors may need to interpret multiple results in order to provide  you with the best plan of care or course of treatment. Therefore, we ask that you please give us  2 business days to thoroughly review all your results before contacting the office for clarification. Should we see a critical lab result, you will be contacted sooner.   If You Need Anything After Your Visit  If you have any questions or concerns for your doctor, please call our main line at [phone] and press option 4 to reach your doctor's medical assistant. If no one answers, please leave a voicemail as directed and we will return your call as soon as possible. Messages left after 4 pm will be answered the following business day.   You may also send us  a message via MyChart. We typically respond to MyChart messages within 1-2 business days.  For prescription refills, please ask your pharmacy to contact our office. Our fax number is [phone].  If you have an urgent issue when the clinic is closed that cannot wait until the next business day, you can page your doctor at the number below.    Please note that while we do our best to be available for urgent issues outside of office hours, we are not available 24/7.   If you have an urgent issue and are unable to reach us , you may choose to seek medical care at your doctor's office, retail clinic, urgent care center, or emergency room.  If you have a medical emergency, please immediately call 911 or go to the emergency department.  Pager Numbers  - Dr. Bary Likes: [phone]  - Dr. Annette Barters: [phone]  - Dr. Felipe Horton: [phone]   In the event of inclement weather, please call our main line at [phone] for an update on the status of any delays or closures.  Dermatology Medication Tips: Please keep the boxes that topical medications come in in order to help keep track of the instructions about where and how to use these. Pharmacies typically print the medication instructions only on the boxes and not directly on the medication  tubes.   If your medication is too expensive, please contact our office at [phone] option 4 or send us  a message through MyChart.   We are unable to tell what your co-pay for medications will be in advance as this is different depending on your insurance coverage. However, we may be able to find a substitute medication at lower cost or fill out paperwork to get insurance to cover a needed medication.   If a prior authorization is required to get your medication covered by your insurance company, please allow us  1-2 business days to complete this process.  Drug prices often vary depending on where the prescription is filled and some pharmacies may offer cheaper prices.  The website www.goodrx.com contains coupons for medications through different pharmacies. The prices here do not account for what the cost may be with help from insurance (it may be cheaper with your insurance), but the website can give you the price if you did not use any insurance.  - You can print the associated coupon and take it with your prescription to the pharmacy.  - You may also stop by our office during regular business hours and pick up a GoodRx coupon card.  - If  you need your prescription sent electronically to a different pharmacy, notify our office through [facility] or by phone at [phone] option 4.     Si Usted Necesita Algo Despus de Su Visita  Tambin puede enviarnos un mensaje a travs de Clinical cytogeneticist. Por lo general respondemos a los mensajes de MyChart en el transcurso de 1 a 2 das hbiles.  Para renovar recetas, por favor pida a su farmacia que se ponga en contacto con nuestra oficina. Franz Jacks de fax es [phone].  Si tiene un asunto urgente cuando la clnica est cerrada y que no puede esperar hasta el siguiente da hbil, puede llamar/localizar a su doctor(a) al nmero que aparece a continuacin.   Por favor, tenga en cuenta que aunque hacemos todo lo posible para estar  disponibles para asuntos urgentes fuera del horario de [place], no estamos disponibles las 24 horas del da, los 7 [address]  [address] de la [place].   Si tiene un problema urgente y no puede comunicarse con nosotros, puede optar por buscar atencin mdica  en el consultorio de su doctor(a), en una clnica privada, en un centro de atencin urgente o en una sala de emergencias.  Si tiene Engineer, drilling, por favor llame inmediatamente al 911 o vaya a la sala de emergencias.  Nmeros de bper  - Dr. Bary Likes: [phone]  - Dra. Annette Barters: [phone]  - Dr. Felipe Horton: [phone]   En caso de inclemencias del tiempo, por favor llame a Lajuan Pila principal al [phone] para una actualizacin sobre el [place] de cualquier retraso o cierre.  Consejos para la medicacin en dermatologa: Por favor, guarde las cajas en las que vienen los medicamentos de uso tpico para ayudarle a seguir las instrucciones sobre dnde y cmo usarlos. Las farmacias generalmente imprimen las instrucciones del medicamento slo en las cajas y no directamente en los tubos del [place].   Si su medicamento es muy caro, por favor, pngase en contacto con Bettyjane Brunet llamando al [phone] y presione la opcin 4 o envenos un mensaje a travs de Clinical cytogeneticist.   No podemos decirle cul ser su copago por los medicamentos por adelantado ya que esto es diferente dependiendo de la cobertura de su seguro. Sin embargo, es posible que podamos encontrar un medicamento sustituto a Audiological scientist un formulario para que el seguro cubra el medicamento que se considera necesario.   Si se requiere una autorizacin previa para que su compaa de seguros [place] su medicamento, por favor permtanos de 1 a 2 das hbiles para completar este proceso.  Los precios de los medicamentos varan con frecuencia dependiendo del Environmental consultant de dnde se surte la receta y alguna farmacias pueden ofrecer precios ms baratos.  El sitio web www.goodrx.com  tiene cupones para medicamentos de Health and safety inspector. Los precios aqu no tienen en cuenta lo que podra costar con la ayuda del seguro (puede ser ms barato con su seguro), pero el sitio web puede darle el precio si no utiliz Tourist information centre manager.  - Puede imprimir el cupn correspondiente y llevarlo con su receta a la farmacia.  - Tambin puede pasar por nuestra oficina durante el horario de atencin regular y Education officer, museum una tarjeta de cupones de GoodRx.  - Si necesita que su receta se enve electrnicamente a una farmacia diferente, informe a nuestra oficina a travs de MyChart de [place] o por telfono llamando al [phone] y presione la opcin 4.

## 2024-05-01 NOTE — Progress Notes (Signed)
 Follow-Up Visit   Subjective  Douglas Vega is a 58 y.o. male who presents for the following: Yearly f/u on [facility] on his nose. Hx of precancers, hx of BCC The patient has spots, moles and lesions to be evaluated, some may be new or changing and the patient may have concern these could be cancer.  The following portions of the chart were reviewed this encounter and updated as appropriate: medications, allergies, medical history  Review of Systems:  No other skin or systemic complaints except as noted in HPI or Assessment and Plan.  Objective  Well appearing patient in no apparent distress; mood and affect are within normal limits.  A focused examination was performed of the following areas: face Relevant exam findings are noted in the Assessment and Plan.  left temple Stuck-on, waxy, tan-brown papules and plaques -- Discussed benign etiology and prognosis.  left face x 3 (3) Erythematous thin papules/macules with gritty scale.        Assessment & Plan   INFLAMED SEBORRHEIC KERATOSIS left temple Symptomatic, irritating, patient would like treated.  Destruction of lesion - left temple Complexity: simple   Destruction method: cryotherapy   Informed consent: discussed and consent obtained   Timeout:  patient name, date of birth, surgical site, and procedure verified Lesion destroyed using liquid nitrogen: Yes   Region frozen until ice ball extended beyond lesion: Yes   Outcome: patient tolerated procedure well with no complications   Post-procedure details: wound care instructions given   AK (ACTINIC KERATOSIS) (3) left face x 3 (3) ACTINIC DAMAGE WITH PRECANCEROUS ACTINIC KERATOSES Counseling for Topical Chemotherapy Management: Patient exhibits: - Severe, confluent actinic changes with pre-cancerous actinic keratoses that is secondary to cumulative UV radiation exposure over time - Condition that is severe; chronic, not at goal. - diffuse scaly erythematous macules  and papules with underlying dyspigmentation - Discussed Prescription "Field Treatment" topical Chemotherapy for Severe, Chronic Confluent Actinic Changes with Pre-Cancerous Actinic Keratoses Field treatment involves treatment of an entire area of skin that has confluent Actinic Changes (Sun/ Ultraviolet light damage) and PreCancerous Actinic Keratoses by method of PhotoDynamic Therapy (PDT) and/or prescription Topical Chemotherapy agents such as 5-fluorouracil , 5-fluorouracil /calcipotriene , and/or imiquimod.  The purpose is to decrease the number of clinically evident and subclinical PreCancerous lesions to prevent progression to development of skin cancer by chemically destroying early precancer changes that may or may not be visible.  It has been shown to reduce the risk of developing skin cancer in the treated area. As a result of treatment, redness, scaling, crusting, and open sores may occur during treatment course. One or more than one of these methods may be used and may have to be used several times to control, suppress and eliminate the PreCancerous changes. Discussed treatment course, expected reaction, and possible side effects. - Recommend daily broad spectrum sunscreen SPF 30+ to sun-exposed areas, reapply every 2 hours as needed.  - Staying in the shade or wearing long sleeves, sun glasses (UVA+UVB protection) and wide brim hats (4-inch brim around the entire circumference of the hat) are also recommended. - Call for new or changing lesions.   Start 5FU/Calcipotriene  cream on July 15,  apply to affected areas on his nose x 2 and bilateral cheeks twice a day x 7 days    Destruction of lesion - left face x 3 (3) Complexity: simple   Destruction method: cryotherapy   Informed consent: discussed and consent obtained   Timeout:  patient name, date of birth, surgical  site, and procedure verified Lesion destroyed using liquid nitrogen: Yes   Region frozen until ice ball extended beyond lesion:  Yes   Outcome: patient tolerated procedure well with no complications   Post-procedure details: wound care instructions given   Related Medications fluorouracil  (EFUDEX ) 5 % cream Apply to affected areas on his nose twice a day x 7 days ACTINIC SKIN DAMAGE   CHEMOTHERAPY MANAGEMENT, ENCOUNTER FOR   COUNSELING AND COORDINATION OF CARE   MEDICATION MANAGEMENT   HISTORY OF BASAL CELL CARCINOMA   PRURIGO NODULARIS     HISTORY OF BASAL CELL CARCINOMA OF THE SKIN - Chin - No evidence of recurrence today - Recommend regular full body skin exams - Recommend daily broad spectrum sunscreen SPF 30+ to sun-exposed areas, reapply every 2 hours as needed.  - Call if any new or changing lesions are noted between office visits    TEXTURE CHANGEs 2 areas of nose  Use 5FU/ Calcipotriene  mix on these 2 areas and to bilateral cheeks bid x 7 days starting July 15,  Recheck areas next visit. 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 Chronic and persistent condition with duration over one year. Condition is symptomatic / bothersome to patient. Not to goal. Improved since previous ILK injection.  Pt so significantly improved today that appears mostly clear.  I do not feel he needs repeat ILK and would recommend he continue to be vigilant about keeping his hands off area and continue topical treatment below.   Patient does admit to rubbing picking and shaving with a razor at the spots on his nose that tends to get thick and scaly and rough and scabbed. This scaly  roughness tends to make him want to pick more.  He knows it is a habit and he feels like he is getting better with avoiding picking the area. He states he has had a habit of picking on his face causing scabs since a teenager.  He tends to do it subconsciously when he is deep in thought, but does know he is doing it.   He is willing to get psychiatric help for this habit if needed.   [facility] site is within and adjacent to:  Bx proven AK with excoriation of nasal tip (01/15/2020). Hx of previous treatment with LN2 and 5FU treatment multiple times.   Dr Court Distance has seen pt with me sometime in 2023 and evaluated with dermoscopy with "benign" appearance. Dr Annette Barters evaluated the patient on 05/31/2022 visit with me and agreed with plan.   Return in about 3 months (around 08/01/2024) for AKs, LSC, hx of BCC.  IClara Crisp, CMA, am acting as scribe for Celine Collard, MD .   Documentation: I have reviewed the above documentation for accuracy and completeness, and I agree with the above.  Celine Collard, MD

## 2024-07-31 ENCOUNTER — Ambulatory Visit: Admitting: Dermatology

## 2024-11-13 ENCOUNTER — Ambulatory Visit: Admitting: Dermatology

## 2024-11-13 ENCOUNTER — Encounter: Payer: Self-pay | Admitting: Dermatology

## 2024-11-13 DIAGNOSIS — B009 Herpesviral infection, unspecified: Secondary | ICD-10-CM

## 2024-11-13 DIAGNOSIS — L570 Actinic keratosis: Secondary | ICD-10-CM | POA: Diagnosis not present

## 2024-11-13 DIAGNOSIS — W908XXA Exposure to other nonionizing radiation, initial encounter: Secondary | ICD-10-CM

## 2024-11-13 DIAGNOSIS — Z85828 Personal history of other malignant neoplasm of skin: Secondary | ICD-10-CM | POA: Diagnosis not present

## 2024-11-13 DIAGNOSIS — L578 Other skin changes due to chronic exposure to nonionizing radiation: Secondary | ICD-10-CM | POA: Diagnosis not present

## 2024-11-13 DIAGNOSIS — L821 Other seborrheic keratosis: Secondary | ICD-10-CM

## 2024-11-13 DIAGNOSIS — Z79899 Other long term (current) drug therapy: Secondary | ICD-10-CM

## 2024-11-13 DIAGNOSIS — L820 Inflamed seborrheic keratosis: Secondary | ICD-10-CM

## 2024-11-13 DIAGNOSIS — L280 Lichen simplex chronicus: Secondary | ICD-10-CM | POA: Diagnosis not present

## 2024-11-13 DIAGNOSIS — A609 Anogenital herpesviral infection, unspecified: Secondary | ICD-10-CM

## 2024-11-13 DIAGNOSIS — Z7189 Other specified counseling: Secondary | ICD-10-CM

## 2024-11-13 DIAGNOSIS — B001 Herpesviral vesicular dermatitis: Secondary | ICD-10-CM

## 2024-11-13 DIAGNOSIS — D229 Melanocytic nevi, unspecified: Secondary | ICD-10-CM

## 2024-11-13 DIAGNOSIS — L814 Other melanin hyperpigmentation: Secondary | ICD-10-CM

## 2024-11-13 MED ORDER — VALACYCLOVIR HCL 1 G PO TABS: ORAL_TABLET | ORAL | 11 refills | Status: AC

## 2024-11-13 NOTE — Patient Instructions (Addendum)
 " Valtrex   Take 1 tablet (1,000 mg total) by mouth See admin instructions. Take 2 tabs by mouth at first onset of tingling or burning sensation, then take 2 tablets by mouth 12 hours later.    Actinic keratoses are precancerous spots that appear secondary to cumulative UV radiation exposure/sun exposure over time. They are chronic with expected duration over 1 year. A portion of actinic keratoses will progress to squamous cell carcinoma of the skin. It is not possible to reliably predict which spots will progress to skin cancer and so treatment is recommended to prevent development of skin cancer.  Recommend daily broad spectrum sunscreen SPF 30+ to sun-exposed areas, reapply every 2 hours as needed.  Recommend staying in the shade or wearing long sleeves, sun glasses (UVA+UVB protection) and wide brim hats (4-inch brim around the entire circumference of the hat). Call for new or changing lesions.    Cryotherapy Aftercare  Wash gently with soap and water  everyday.   Apply Vaseline and Band-Aid daily until healed.    Due to recent changes in healthcare laws, you may see results of your pathology and/or laboratory studies on MyChart before the doctors have had a chance to review them. We understand that in some cases there may be results that are confusing or concerning to you. Please understand that not all results are received at the same time and often the doctors may need to interpret multiple results in order to provide you with the best plan of care or course of treatment. Therefore, we ask that you please give us  2 business days to thoroughly review all your results before contacting the office for clarification. Should we see a critical lab result, you will be contacted sooner.   If You Need Anything After Your Visit  If you have any questions or concerns for your doctor, please call our main line at [phone] and press option 4 to reach your doctor's medical assistant. If no one  answers, please leave a voicemail as directed and we will return your call as soon as possible. Messages left after 4 pm will be answered the following business day.   You may also send us  a message via MyChart. We typically respond to MyChart messages within 1-2 business days.  For prescription refills, please ask your pharmacy to contact our office. Our fax number is [phone].  If you have an urgent issue when the clinic is closed that cannot wait until the next business day, you can page your doctor at the number below.    Please note that while we do our best to be available for urgent issues outside of office hours, we are not available 24/7.   If you have an urgent issue and are unable to reach us , you may choose to seek medical care at your doctor's office, retail clinic, urgent care center, or emergency room.  If you have a medical emergency, please immediately call 911 or go to the emergency department.  Pager Numbers  - Dr. Hester: [phone]  - Dr. Jackquline: [phone]  - Dr. Claudene: [phone]   - Dr. Raymund: [phone]  In the event of inclement weather, please call our main line at [phone] for an update on the status of any delays or closures.  Dermatology Medication Tips: Please keep the boxes that topical medications come in in order to help keep track of the instructions about where and how to use these. Pharmacies typically print the medication instructions only on the boxes and not directly  on the medication tubes.   If your medication is too expensive, please contact our office at [phone] option 4 or send us  a message through MyChart.   We are unable to tell what your co-pay for medications will be in advance as this is different depending on your insurance coverage. However, we may be able to find a substitute medication at lower cost or fill out paperwork to get insurance to cover a needed medication.   If a prior authorization is required  to get your medication covered by your insurance company, please allow us  1-2 business days to complete this process.  Drug prices often vary depending on where the prescription is filled and some pharmacies may offer cheaper prices.  The website www.goodrx.com contains coupons for medications through different pharmacies. The prices here do not account for what the cost may be with help from insurance (it may be cheaper with your insurance), but the website can give you the price if you did not use any insurance.  - You can print the associated coupon and take it with your prescription to the pharmacy.  - You may also stop by our office during regular business hours and pick up a GoodRx coupon card.  - If you need your prescription sent electronically to a different pharmacy, notify our office through [facility] or by phone at [phone] option 4.     Si Usted Necesita Algo Despus de Su Visita  Tambin puede enviarnos un mensaje a travs de Clinical Cytogeneticist. Por lo general respondemos a los mensajes de MyChart en el transcurso de 1 a 2 das hbiles.  Para renovar recetas, por favor pida a su farmacia que se ponga en contacto con nuestra oficina. Randi lakes de fax es [place] [phone].  Si tiene un asunto urgente cuando la clnica est cerrada y que no puede esperar hasta el siguiente da hbil, puede llamar/localizar a su doctor(a) al nmero que aparece a continuacin.   Por favor, tenga en cuenta que aunque hacemos todo lo posible para estar disponibles para asuntos urgentes fuera del horario de [place], no estamos disponibles las 24 horas del da, los 7 [address]  [phone] de la [place].   Si tiene un problema urgente y no puede comunicarse con nosotros, puede optar por buscar atencin mdica  en el consultorio de su doctor(a), en una clnica privada, en un centro de atencin urgente o en una sala de emergencias.  Si tiene engineer, drilling, por favor llame inmediatamente al 911 o vaya a la sala  de emergencias.  Nmeros de bper  - Dr. Hester: [phone]  - Dra. Jackquline: [phone]  - Dr. Claudene: [phone]  - Dra. Kitts: [phone]  En caso de inclemencias del [place], por favor llame a nuestra lnea principal al [phone] para una actualizacin sobre el estado de cualquier retraso o cierre.  Consejos para la medicacin en dermatologa: Por favor, guarde las cajas en las que vienen los medicamentos de uso tpico para ayudarle a seguir las instrucciones sobre dnde y cmo usarlos. Las farmacias generalmente imprimen las instrucciones del medicamento slo en las cajas y no directamente en los tubos del [place].   Si su medicamento es muy caro, por favor, pngase en contacto con landry rieger llamando al [phone] y presione la opcin 4 o envenos un mensaje a travs de Clinical Cytogeneticist.   No podemos decirle cul ser su copago por los medicamentos por adelantado ya que esto es diferente dependiendo de la cobertura de su seguro. Sin embargo, es posible que  podamos encontrar un medicamento sustituto a audiological scientist un formulario para que el seguro cubra el medicamento que se considera necesario.   Si se requiere una autorizacin previa para que su compaa de seguros cubra su medicamento, por favor permtanos de 1 a 2 das hbiles para completar este proceso.  Los precios de los medicamentos varan con frecuencia dependiendo del environmental consultant de dnde se surte la receta y alguna farmacias pueden ofrecer precios ms baratos.  El sitio web www.goodrx.com tiene cupones para medicamentos de health and safety inspector. Los precios aqu no tienen en cuenta lo que podra costar con la ayuda del seguro (puede ser ms barato con su seguro), pero el sitio web puede darle el precio si no utiliz tourist information centre manager.  - Puede imprimir el cupn correspondiente y llevarlo con su receta a la farmacia.  - Tambin puede pasar por nuestra oficina durante el horario de atencin regular y education officer, museum una  tarjeta de cupones de GoodRx.  - Si necesita que su receta se enve electrnicamente a una farmacia diferente, informe a nuestra oficina a travs de MyChart de [place] o por telfono llamando al [phone] y presione la opcin 4.  "

## 2024-11-13 NOTE — Progress Notes (Signed)
 "  Follow-Up Visit   Subjective  Douglas Vega is a 58 y.o. male who presents for the following: 3 month follow up  Hx of aks and isks  Hx of LSC left nasal tip  Patient states he used 5 f/u calcipotriene  cream to cheeks and nose and did get reaction  The following portions of the chart were reviewed this encounter and updated as appropriate: medications, allergies, medical history  Review of Systems:  No other skin or systemic complaints except as noted in HPI or Assessment and Plan.  Objective  Well appearing patient in no apparent distress; mood and affect are within normal limits.   A focused examination was performed of the following areas: Face, nose  Relevant exam findings are noted in the Assessment and Plan. face x 3 (3) Erythematous thin papules/macules with gritty scale.  right face x 3 Erythematous stuck-on, waxy papule or plaque  Assessment & Plan   HISTORY OF BASAL CELL CARCINOMA OF THE SKIN 02/28/2019 right anterior chin - excised - 10/22/2019 lateral and deep margin involved - Mohs - No evidence of recurrence today - Recommend regular full body skin exams - Recommend daily broad spectrum sunscreen SPF 30+ to sun-exposed areas, reapply every 2 hours as needed.  - Call if any new or changing lesions are noted between office visits   SEBORRHEIC KERATOSIS - Stuck-on, waxy, tan-brown papules and/or plaques  - Benign-appearing - Discussed benign etiology and prognosis. - Observe - Call for any changes  ACTINIC DAMAGE - chronic, secondary to cumulative UV radiation exposure/sun exposure over time - diffuse scaly erythematous macules with underlying dyspigmentation - Recommend daily broad spectrum sunscreen SPF 30+ to sun-exposed areas, reapply every 2 hours as needed.  - Recommend staying in the shade or wearing long sleeves, sun glasses (UVA+UVB protection) and wide brim hats (4-inch brim around the entire circumference of the hat). - Call for new or  changing lesions.  History of  TEXTURE CHANGES 2 areas of nose  Left proximal nasal ala rim 0.4 cm patch observed no changes at visit today - will continue to monitor   right nasal tip (Lichen simplex/Prurigo nodule - due to picking and history of biopsy proven AK)) -  Mostly clear today. See photo from last visit  Avoid picking and manipulation.  Lichen simplex chronicus L nasal tip Clear today  History of Lichen simplex chronicus - Much improved today and mostly clear, (but improved compared to last visit - see photos) - likely due to chronic and frequent factitial manipulation (picking; rubbing; shaving with razor) admitted to by pt. Chronic and persistent condition with duration over one year. Condition is symptomatic / bothersome to patient. Not to goal. Improved since previous ILK injection.  Pt so significantly improved today that appears mostly clear.  I do not feel he needs repeat ILK and would recommend he continue to be vigilant about keeping his hands off area and continue topical treatment below.   Patient does admit to rubbing picking and shaving with a razor at the spots on his nose that tends to get thick and scaly and rough and scabbed. This scaly roughness tends to make him want to pick more.  He knows it is a habit and he feels like he is getting better with avoiding picking the area. He states he has had a habit of picking on his face causing scabs since a teenager.  He tends to do it subconsciously when he is deep in thought, but does know he  is doing it.   He is willing to get psychiatric help for this habit if needed.   Above [facility] site is within and adjacent to:  Bx proven AK with excoriation of nasal tip (01/15/2020). Hx of previous treatment with LN2 and 5FU treatment multiple times.   Dr Ethan has seen pt with me sometime in 2023 and evaluated with dermoscopy with benign appearance. Dr Jackquline evaluated the patient on 05/31/2022 visit with me and agreed with  plan. Clear today.  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 Condition is symptomatic/ bothersome to patient. Not currently at goal. Herpes Simplex Virus = Cold Sores = Fever Blisters is a chronic recurring blistering; scabbing sore-producing viral infection that is recurrent usually in the same area triggered by stress, sun/UV exposure and trauma.  It is infectious and can be spread from person to person by direct contact.  It is not curable, but is treatable with topical and oral medication. Treatment Plan Start Valtrex   Take 1 tablet (1,000 mg total) by mouth See admin instructions. Take 2 tabs by mouth at first onset of tingling or burning sensation, then take 2 tablets by mouth 12 hours later. 24 pills 11rfs ACTINIC KERATOSIS (3) face x 3 (3) Actinic keratoses are precancerous spots that appear secondary to cumulative UV radiation exposure/sun exposure over time. They are chronic with expected duration over 1 year. A portion of actinic keratoses will progress to squamous cell carcinoma of the skin. It is not possible to reliably predict which spots will progress to skin cancer and so treatment is recommended to prevent development of skin cancer.  Recommend daily broad spectrum sunscreen SPF 30+ to sun-exposed areas, reapply every 2 hours as needed.  Recommend staying in the shade or wearing long sleeves, sun glasses (UVA+UVB protection) and wide brim hats (4-inch brim around the entire circumference of the hat). Call for new or changing lesions. - Destruction of lesion - face x 3 (3) Complexity: simple   Destruction method: cryotherapy   Informed consent: discussed and consent obtained   Timeout:  patient name, date of birth, surgical site, and procedure verified Lesion destroyed using liquid nitrogen: Yes   Region frozen until ice ball extended beyond lesion: Yes    Outcome: patient tolerated procedure well with no complications   Post-procedure details: wound care instructions given    HERPESVIRAL INFECTION   This Visit - valACYclovir  (VALTREX ) 1000 MG tablet - Take 1 tablet (1,000 mg total) by mouth See admin instructions Take 2 tabs by mouth at first onset of tingling or burning sensation, then take 2 tablets by mouth 12 hours later INFLAMED SEBORRHEIC KERATOSIS right face x 3 Symptomatic, irritating, patient deferred treatment today  Will plan to treat 3 isk at right face at next follow up - Destruction of lesion - right face x 3 Complexity: simple   Destruction method: cryotherapy   Informed consent: discussed and consent obtained   Timeout:  patient name, date of birth, surgical site, and procedure verified Lesion destroyed using liquid nitrogen: Yes   Region frozen until ice ball extended beyond lesion: Yes   Outcome: patient tolerated procedure well with no complications   Post-procedure details: wound care instructions given     Return in about 6 months (around 05/14/2025) for ak and lsc with ubse .  LILLETTE Eleanor Blush, CMA, am acting as scribe for Alm Rhyme, MD.   Documentation: I  have reviewed the above documentation for accuracy and completeness, and I agree with the above.  Alm Rhyme, MD    "

## 2025-05-28 ENCOUNTER — Ambulatory Visit: Admitting: Dermatology
